# Patient Record
Sex: Male | Born: 1995 | Hispanic: Yes | Marital: Married | Smoking: Never smoker
Health system: Southern US, Community
[De-identification: ages and names within clinical notes are randomized; demographics above are authoritative.]

## PROBLEM LIST (undated history)

## (undated) DIAGNOSIS — F1010 Alcohol abuse, uncomplicated: Secondary | ICD-10-CM

## (undated) DIAGNOSIS — F1910 Other psychoactive substance abuse, uncomplicated: Secondary | ICD-10-CM

---

## 2020-10-27 ENCOUNTER — Emergency Department (HOSPITAL_BASED_OUTPATIENT_CLINIC_OR_DEPARTMENT_OTHER)
Admission: EM | Admit: 2020-10-27 | Discharge: 2020-10-27 | Disposition: A | Discharge status: Home / Self Care | Payer: 59 | Attending: Emergency Medicine | Admitting: Emergency Medicine

## 2020-10-27 ENCOUNTER — Emergency Department (HOSPITAL_BASED_OUTPATIENT_CLINIC_OR_DEPARTMENT_OTHER): Payer: 59

## 2020-10-27 ENCOUNTER — Encounter (HOSPITAL_BASED_OUTPATIENT_CLINIC_OR_DEPARTMENT_OTHER): Payer: Self-pay | Admitting: Emergency Medicine

## 2020-10-27 ENCOUNTER — Other Ambulatory Visit: Payer: Self-pay

## 2020-10-27 DIAGNOSIS — S82855A Nondisplaced trimalleolar fracture of left lower leg, initial encounter for closed fracture: Secondary | ICD-10-CM | POA: Diagnosis not present

## 2020-10-27 DIAGNOSIS — X58XXXA Exposure to other specified factors, initial encounter: Secondary | ICD-10-CM | POA: Diagnosis not present

## 2020-10-27 DIAGNOSIS — S82852A Displaced trimalleolar fracture of left lower leg, initial encounter for closed fracture: Secondary | ICD-10-CM

## 2020-10-27 DIAGNOSIS — S99912A Unspecified injury of left ankle, initial encounter: Secondary | ICD-10-CM | POA: Diagnosis present

## 2020-10-27 MED ORDER — OXYCODONE-ACETAMINOPHEN 5-325 MG PO TABS
1.0000 | ORAL_TABLET | Freq: Once | ORAL | Status: AC
Start: 2020-10-27 — End: 2020-10-27
  Administered 2020-10-27: 1 via ORAL
  Filled 2020-10-27: qty 1

## 2020-10-27 MED ORDER — OXYCODONE-ACETAMINOPHEN 5-325 MG PO TABS: 1.0000 | ORAL_TABLET | Freq: Three times a day (TID) | ORAL | 0 refills | Status: DC | PRN

## 2020-10-27 NOTE — ED Notes (Signed)
Portable Xray at bedside.

## 2020-10-27 NOTE — ED Notes (Signed)
Patient transported to CT 

## 2020-10-27 NOTE — ED Provider Notes (Signed)
[facility] EMERGENCY DEPARTMENT Provider Note   CSN: [id number] Arrival date & time: 10/27/20  9476     History Chief Complaint  Patient presents with  . Leg Pain    Jason Cruz is a 25 y.o. male.  HPI Patient presents with left ankle injury.  On 2 April patient had a trimalleolar fracture in [place].  Reportedly has seen the ER and then followed up with another clinic.  Plan was to have surgery but not done there since insurance was in [place] and patient wanted to recover here.  Now has a cast in place.  Continued pain.  Comes for referral and surgery.  No other injury.  Otherwise healthy.    History reviewed. No pertinent past medical history.  There are no problems to display for this patient.   History reviewed. No pertinent surgical history.     No family history on file.  Social History   Tobacco Use  . Smoking status: Never Smoker  . Smokeless tobacco: Never Used  Substance Use Topics  . Alcohol use: Yes    Alcohol/week: 12.0 standard drinks    Types: 12 Cans of beer per week  . Drug use: Yes    Types: Marijuana    Home Medications Prior to Admission medications   Medication Sig Start Date End Date Taking? Authorizing Provider  oxyCODONE-acetaminophen (PERCOCET/ROXICET) 5-325 MG tablet Take 1-2 tablets by mouth every 8 (eight) hours as needed for severe pain. 10/27/20  Yes Benjiman Core, MD    Allergies    Patient has no allergy information on record.  Review of Systems   Review of Systems  Constitutional: Negative for appetite change.  Respiratory: Negative for shortness of breath.   Musculoskeletal:       Left ankle pain.  Skin: Negative for wound.  Neurological: Negative for weakness.    Physical Exam Updated Vital Signs BP 118/76   Pulse 66   Temp 98 F (36.7 C) (Oral)   Resp 20   Ht 5\' 9"  (1.753 m)   Wt 80.3 kg   SpO2 100%   BMI 26.14 kg/m   Physical Exam Vitals and nursing note reviewed.  HENT:     Head:  Atraumatic.  Eyes:     General: No scleral icterus. Musculoskeletal:     Comments: Left lower leg in cast.  Sensation grossly intact in toes.  Good capillary refill in toes.  Skin:    General: Skin is warm.  Neurological:     Mental Status: He is alert and oriented to person, place, and time.     ED Results / Procedures / Treatments   Labs (all labs ordered are listed, but only abnormal results are displayed) Labs Reviewed - No data to display  EKG None  Radiology DG Ankle Complete Left  Result Date: 10/27/2020 CLINICAL DATA:  Trimalleolar fracture EXAM: LEFT ANKLE COMPLETE - 3+ VIEW COMPARISON:  None. FINDINGS: Trimalleolar ankle fracture with cast in place. Medial clear space widening to 6 mm. Mild lateral displacement of the lateral malleolus. IMPRESSION: Casted trimalleolar fracture with medial clear space widening. Electronically Signed   By: 12/27/2020 M.D.   On: 10/27/2020 08:59   CT Ankle Left Wo Contrast  Result Date: 10/27/2020 CLINICAL DATA:  Trimalleolar fracture after ankle injury a week ago. EXAM: CT OF THE LEFT ANKLE WITHOUT CONTRAST TECHNIQUE: Multidetector CT imaging of the left ankle was performed according to the standard protocol. Multiplanar CT image reconstructions were also generated. COMPARISON:  Left  ankle x-rays from same day. FINDINGS: Bones/Joint/Cartilage Acute nondisplaced predominantly longitudinal fracture of the medial malleolus. Acute minimally displaced oblique fracture of the lateral malleolus with 4 mm posterior displacement and 7 mm of overriding. Acute longitudinal fracture of the posterior malleolus with 2 mm articular surface depression (series 10, image 53). No dislocation. Unchanged mild medial clear space widening. The ankle mortise is symmetric. The talar dome is intact. Joint spaces are preserved. Small tibiotalar joint effusion. Ligaments Ligaments are suboptimally evaluated by CT. Muscles and Tendons Grossly intact. Soft tissue Diffuse  soft tissue swelling. No fluid collection or hematoma. No soft tissue mass. IMPRESSION: 1. Acute trimalleolar fracture as described above with evidence of instability. Electronically Signed   By: Obie Dredge M.D.   On: 10/27/2020 11:44    Procedures Procedures   Medications Ordered in ED Medications  oxyCODONE-acetaminophen (PERCOCET/ROXICET) 5-325 MG per tablet 1 tablet (1 tablet Oral Given 10/27/20 3825)    ED Course  I have reviewed the triage vital signs and the nursing notes.  Pertinent labs & imaging results that were available during my care of the patient were reviewed by me and considered in my medical decision making (see chart for details).    MDM Rules/Calculators/A&P                         Patient with trimalleolar ankle fracture.  Had been injured 8 days ago in [place].  Had seen Ortho follow-up but has not had surgery arranged.  Came back to [place] for treatment.  X-ray done and showed injury.  Discussed with Dr. Shon Baton from orthopedic surgery.  Recommended CT scan for operative planning and follow-up with Dr. Victorino Dike or Dr. Aundria Rud.  Should be hopefully able to get in tomorrow on Monday.  Patient given information and some pain medicine.  Outpatient follow-up tomorrow.   Final Clinical Impression(s) / ED Diagnoses Final diagnoses:  Closed trimalleolar fracture of left ankle, initial encounter    Rx / DC Orders ED Discharge Orders         Ordered    oxyCODONE-acetaminophen (PERCOCET/ROXICET) 5-325 MG tablet  Every 8 hours PRN        10/27/20 1150           Benjiman Core, MD 10/27/20 1513

## 2020-10-27 NOTE — ED Notes (Signed)
ED Provider at bedside. 

## 2020-10-27 NOTE — ED Triage Notes (Signed)
Pt arrives pov with report of L ankle fx x 1 week pta. Pt in cast, needs referral for surgery. Pt requesting pain medication. Pt reports in waiting for surgery d/t insurance issue

## 2020-10-27 NOTE — Discharge Instructions (Signed)
Call the office for follow-up with either Dr. Victorino Dike or Dr. Aundria Rud tomorrow.  We had talked with Dr. Shon Baton today.  Take the pain medicine help with the pain.

## 2020-10-28 ENCOUNTER — Other Ambulatory Visit (HOSPITAL_COMMUNITY): Payer: Self-pay | Admitting: Orthopedic Surgery

## 2020-10-28 ENCOUNTER — Encounter (HOSPITAL_BASED_OUTPATIENT_CLINIC_OR_DEPARTMENT_OTHER): Payer: Self-pay | Admitting: Orthopedic Surgery

## 2020-10-28 ENCOUNTER — Other Ambulatory Visit: Payer: Self-pay

## 2020-10-29 ENCOUNTER — Other Ambulatory Visit (HOSPITAL_COMMUNITY): Payer: 59

## 2020-10-30 ENCOUNTER — Other Ambulatory Visit (HOSPITAL_COMMUNITY)
Admission: RE | Admit: 2020-10-30 | Discharge: 2020-10-30 | Disposition: A | Discharge status: Home / Self Care | Payer: 59 | Source: Ambulatory Visit | Attending: Orthopedic Surgery | Admitting: Orthopedic Surgery

## 2020-10-30 DIAGNOSIS — Z20822 Contact with and (suspected) exposure to covid-19: Secondary | ICD-10-CM | POA: Diagnosis not present

## 2020-10-30 DIAGNOSIS — Z01812 Encounter for preprocedural laboratory examination: Secondary | ICD-10-CM | POA: Diagnosis present

## 2020-10-30 LAB — SARS CORONAVIRUS 2 (TAT 6-24 HRS): SARS Coronavirus 2: NEGATIVE

## 2020-10-30 NOTE — Progress Notes (Signed)
Text message sent reminding patient of COVID test.  

## 2020-10-31 ENCOUNTER — Encounter (HOSPITAL_BASED_OUTPATIENT_CLINIC_OR_DEPARTMENT_OTHER)
Admission: RE | Disposition: A | Discharge status: Home / Self Care | Payer: Self-pay | Source: Home / Self Care | Attending: Orthopedic Surgery

## 2020-10-31 ENCOUNTER — Ambulatory Visit (HOSPITAL_BASED_OUTPATIENT_CLINIC_OR_DEPARTMENT_OTHER)
Admission: RE | Admit: 2020-10-31 | Discharge: 2020-10-31 | Disposition: A | Discharge status: Home / Self Care | Payer: 59 | Attending: Orthopedic Surgery | Admitting: Orthopedic Surgery

## 2020-10-31 ENCOUNTER — Ambulatory Visit (HOSPITAL_BASED_OUTPATIENT_CLINIC_OR_DEPARTMENT_OTHER): Payer: 59 | Admitting: Certified Registered"

## 2020-10-31 ENCOUNTER — Other Ambulatory Visit: Payer: Self-pay

## 2020-10-31 ENCOUNTER — Encounter (HOSPITAL_BASED_OUTPATIENT_CLINIC_OR_DEPARTMENT_OTHER): Payer: Self-pay | Admitting: Orthopedic Surgery

## 2020-10-31 DIAGNOSIS — S82852A Displaced trimalleolar fracture of left lower leg, initial encounter for closed fracture: Secondary | ICD-10-CM | POA: Diagnosis not present

## 2020-10-31 HISTORY — DX: Other psychoactive substance abuse, uncomplicated: F19.10

## 2020-10-31 HISTORY — PX: ORIF ANKLE FRACTURE: SHX5408

## 2020-10-31 SURGERY — OPEN REDUCTION INTERNAL FIXATION (ORIF) ANKLE FRACTURE
Anesthesia: Regional | Site: Ankle | Laterality: Left

## 2020-10-31 MED ORDER — 0.9 % SODIUM CHLORIDE (POUR BTL) OPTIME
TOPICAL | Status: DC | PRN
  Administered 2020-10-31: 120 mL

## 2020-10-31 MED ORDER — VANCOMYCIN HCL 500 MG IV SOLR
INTRAVENOUS | Status: DC | PRN
  Administered 2020-10-31: 500 mg via TOPICAL

## 2020-10-31 MED ORDER — CEFAZOLIN SODIUM-DEXTROSE 2-4 GM/100ML-% IV SOLN
2.0000 g | INTRAVENOUS | Status: AC
  Administered 2020-10-31: 2 g via INTRAVENOUS

## 2020-10-31 MED ORDER — ONDANSETRON HCL 4 MG/2ML IJ SOLN
INTRAMUSCULAR | Status: DC | PRN
  Administered 2020-10-31: 4 mg via INTRAVENOUS

## 2020-10-31 MED ORDER — LACTATED RINGERS IV SOLN: INTRAVENOUS | Status: DC

## 2020-10-31 MED ORDER — MIDAZOLAM HCL 2 MG/2ML IJ SOLN
INTRAMUSCULAR | Status: AC
  Filled 2020-10-31: qty 2

## 2020-10-31 MED ORDER — FENTANYL CITRATE (PF) 100 MCG/2ML IJ SOLN
50.0000 ug | Freq: Once | INTRAMUSCULAR | Status: AC
  Administered 2020-10-31: 100 ug via INTRAVENOUS

## 2020-10-31 MED ORDER — FENTANYL CITRATE (PF) 100 MCG/2ML IJ SOLN
INTRAMUSCULAR | Status: AC
  Filled 2020-10-31: qty 2

## 2020-10-31 MED ORDER — ROPIVACAINE HCL 5 MG/ML IJ SOLN
INTRAMUSCULAR | Status: DC | PRN
  Administered 2020-10-31: 30 mL via PERINEURAL
  Administered 2020-10-31: 15 mL via PERINEURAL

## 2020-10-31 MED ORDER — OXYCODONE HCL 5 MG PO TABS: 5.0000 mg | ORAL_TABLET | ORAL | 0 refills | Status: AC | PRN

## 2020-10-31 MED ORDER — ASPIRIN EC 81 MG PO TBEC: 81.0000 mg | DELAYED_RELEASE_TABLET | Freq: Two times a day (BID) | ORAL | 0 refills | Status: DC

## 2020-10-31 MED ORDER — DEXAMETHASONE SODIUM PHOSPHATE 10 MG/ML IJ SOLN
INTRAMUSCULAR | Status: DC | PRN
  Administered 2020-10-31 (×2): 5 mg

## 2020-10-31 MED ORDER — CEFAZOLIN SODIUM-DEXTROSE 2-4 GM/100ML-% IV SOLN
INTRAVENOUS | Status: AC
  Filled 2020-10-31: qty 100

## 2020-10-31 MED ORDER — PROPOFOL 10 MG/ML IV BOLUS
INTRAVENOUS | Status: DC | PRN
  Administered 2020-10-31: 200 mg via INTRAVENOUS

## 2020-10-31 MED ORDER — SODIUM CHLORIDE 0.9 % IV SOLN: INTRAVENOUS | Status: DC

## 2020-10-31 MED ORDER — MIDAZOLAM HCL 2 MG/2ML IJ SOLN
1.0000 mg | Freq: Once | INTRAMUSCULAR | Status: AC
  Administered 2020-10-31: 2 mg via INTRAVENOUS

## 2020-10-31 MED ORDER — FENTANYL CITRATE (PF) 100 MCG/2ML IJ SOLN
25.0000 ug | INTRAMUSCULAR | Status: DC | PRN
  Administered 2020-10-31 (×2): 50 ug via INTRAVENOUS

## 2020-10-31 MED ORDER — SENNA 8.6 MG PO TABS: 2.0000 | ORAL_TABLET | Freq: Two times a day (BID) | ORAL | 0 refills | Status: DC

## 2020-10-31 MED ORDER — DOCUSATE SODIUM 100 MG PO CAPS: 100.0000 mg | ORAL_CAPSULE | Freq: Two times a day (BID) | ORAL | 0 refills | Status: AC

## 2020-10-31 MED ORDER — PROPOFOL 500 MG/50ML IV EMUL
INTRAVENOUS | Status: DC | PRN
  Administered 2020-10-31: 25 ug/kg/min via INTRAVENOUS

## 2020-10-31 MED ORDER — VANCOMYCIN HCL 500 MG IV SOLR
INTRAVENOUS | Status: AC
  Filled 2020-10-31: qty 500

## 2020-10-31 MED ORDER — DEXAMETHASONE SODIUM PHOSPHATE 10 MG/ML IJ SOLN
INTRAMUSCULAR | Status: DC | PRN
  Administered 2020-10-31: 4 mg via INTRAVENOUS

## 2020-10-31 SURGICAL SUPPLY — 83 items
APL PRP STRL LF DISP 70% ISPRP (MISCELLANEOUS) ×1
BANDAGE ESMARK 6X9 LF (GAUZE/BANDAGES/DRESSINGS) IMPLANT
BIT DRILL 2.5X2.75 QC CALB (BIT) ×2 IMPLANT
BIT DRILL 2.9 CANN QC NONSTRL (BIT) ×2 IMPLANT
BIT DRILL 3.5X5.5 QC CALB (BIT) ×2 IMPLANT
BLADE SURG 15 STRL LF DISP TIS (BLADE) ×2 IMPLANT
BLADE SURG 15 STRL SS (BLADE) ×4
BNDG CMPR 9X4 STRL LF SNTH (GAUZE/BANDAGES/DRESSINGS)
BNDG CMPR 9X6 STRL LF SNTH (GAUZE/BANDAGES/DRESSINGS)
BNDG COHESIVE 4X5 TAN STRL (GAUZE/BANDAGES/DRESSINGS) ×2 IMPLANT
BNDG COHESIVE 6X5 TAN STRL LF (GAUZE/BANDAGES/DRESSINGS) ×2 IMPLANT
BNDG ESMARK 4X9 LF (GAUZE/BANDAGES/DRESSINGS) IMPLANT
BNDG ESMARK 6X9 LF (GAUZE/BANDAGES/DRESSINGS)
CANISTER SUCT 1200ML W/VALVE (MISCELLANEOUS) ×2 IMPLANT
CHLORAPREP W/TINT 26 (MISCELLANEOUS) ×2 IMPLANT
COVER BACK TABLE 60X90IN (DRAPES) ×2 IMPLANT
COVER WAND RF STERILE (DRAPES) IMPLANT
CUFF TOURN SGL QUICK 34 (TOURNIQUET CUFF) ×2
CUFF TRNQT CYL 34X4.125X (TOURNIQUET CUFF) ×1 IMPLANT
DECANTER SPIKE VIAL GLASS SM (MISCELLANEOUS) IMPLANT
DRAPE EXTREMITY T 121X128X90 (DISPOSABLE) ×2 IMPLANT
DRAPE OEC MINIVIEW 54X84 (DRAPES) ×2 IMPLANT
DRAPE U-SHAPE 47X51 STRL (DRAPES) ×2 IMPLANT
DRSG MEPITEL 4X7.2 (GAUZE/BANDAGES/DRESSINGS) ×2 IMPLANT
DRSG PAD ABDOMINAL 8X10 ST (GAUZE/BANDAGES/DRESSINGS) ×4 IMPLANT
ELECT REM PT RETURN 9FT ADLT (ELECTROSURGICAL) ×2
ELECTRODE REM PT RTRN 9FT ADLT (ELECTROSURGICAL) ×1 IMPLANT
GAUZE SPONGE 4X4 12PLY STRL (GAUZE/BANDAGES/DRESSINGS) ×2 IMPLANT
GLOVE SRG 8 PF TXTR STRL LF DI (GLOVE) ×2 IMPLANT
GLOVE SURG ENC MOIS LTX SZ8 (GLOVE) ×2 IMPLANT
GLOVE SURG LTX SZ8 (GLOVE) ×2 IMPLANT
GLOVE SURG POLYISO LF SZ7 (GLOVE) ×4 IMPLANT
GLOVE SURG UNDER POLY LF SZ7 (GLOVE) ×2 IMPLANT
GLOVE SURG UNDER POLY LF SZ8 (GLOVE) ×4
GOWN STRL REUS W/ TWL LRG LVL3 (GOWN DISPOSABLE) ×1 IMPLANT
GOWN STRL REUS W/ TWL XL LVL3 (GOWN DISPOSABLE) ×2 IMPLANT
GOWN STRL REUS W/TWL LRG LVL3 (GOWN DISPOSABLE) ×2
GOWN STRL REUS W/TWL XL LVL3 (GOWN DISPOSABLE) ×4
K-WIRE ACE 1.6X6 (WIRE) ×4
KWIRE ACE 1.6X6 (WIRE) ×2 IMPLANT
NEEDLE HYPO 22GX1.5 SAFETY (NEEDLE) IMPLANT
NS IRRIG 1000ML POUR BTL (IV SOLUTION) ×2 IMPLANT
PACK BASIN DAY SURGERY FS (CUSTOM PROCEDURE TRAY) ×2 IMPLANT
PAD CAST 4YDX4 CTTN HI CHSV (CAST SUPPLIES) ×1 IMPLANT
PADDING CAST ABS 4INX4YD NS (CAST SUPPLIES)
PADDING CAST ABS COTTON 4X4 ST (CAST SUPPLIES) IMPLANT
PADDING CAST COTTON 4X4 STRL (CAST SUPPLIES) ×2
PADDING CAST COTTON 6X4 STRL (CAST SUPPLIES) ×2 IMPLANT
PENCIL SMOKE EVACUATOR (MISCELLANEOUS) ×2 IMPLANT
PLATE ACE 100DEG 6HOLE (Plate) ×2 IMPLANT
PLATE ACE 3.5MM 2HOLE (Plate) ×2 IMPLANT
SANITIZER HAND PURELL 535ML FO (MISCELLANEOUS) ×2 IMPLANT
SCREW ACE CAN 4.0 42M (Screw) ×2 IMPLANT
SCREW ACE CAN 4.0 46M (Screw) ×2 IMPLANT
SCREW CANN ACE 4.0X32 (Screw) ×2 IMPLANT
SCREW CORTICAL 3.5MM  16MM (Screw) ×2 IMPLANT
SCREW CORTICAL 3.5MM  20MM (Screw) ×1 IMPLANT
SCREW CORTICAL 3.5MM 14MM (Screw) ×4 IMPLANT
SCREW CORTICAL 3.5MM 16MM (Screw) ×2 IMPLANT
SCREW CORTICAL 3.5MM 20MM (Screw) ×1 IMPLANT
SCREW CORTICAL 3.5MM 24MM (Screw) ×2 IMPLANT
SCREW CORTICAL 3.5MM 38MM (Screw) ×2 IMPLANT
SHEET MEDIUM DRAPE 40X70 STRL (DRAPES) ×2 IMPLANT
SLEEVE SCD COMPRESS KNEE MED (STOCKING) ×2 IMPLANT
SPLINT FAST PLASTER 5X30 (CAST SUPPLIES) ×20
SPLINT PLASTER CAST FAST 5X30 (CAST SUPPLIES) ×20 IMPLANT
SPONGE LAP 18X18 RF (DISPOSABLE) ×2 IMPLANT
STOCKINETTE 6  STRL (DRAPES) ×1
STOCKINETTE 6 STRL (DRAPES) ×1 IMPLANT
SUCTION FRAZIER HANDLE 10FR (MISCELLANEOUS) ×1
SUCTION TUBE FRAZIER 10FR DISP (MISCELLANEOUS) ×1 IMPLANT
SUT ETHILON 3 0 PS 1 (SUTURE) ×4 IMPLANT
SUT FIBERWIRE #2 38 T-5 BLUE (SUTURE)
SUT MNCRL AB 3-0 PS2 18 (SUTURE) IMPLANT
SUT VIC AB 0 SH 27 (SUTURE) IMPLANT
SUT VIC AB 2-0 SH 27 (SUTURE) ×4
SUT VIC AB 2-0 SH 27XBRD (SUTURE) ×2 IMPLANT
SUTURE FIBERWR #2 38 T-5 BLUE (SUTURE) IMPLANT
SYR BULB EAR ULCER 3OZ GRN STR (SYRINGE) ×2 IMPLANT
SYR CONTROL 10ML LL (SYRINGE) IMPLANT
TOWEL GREEN STERILE FF (TOWEL DISPOSABLE) ×4 IMPLANT
TUBE CONNECTING 20X1/4 (TUBING) ×2 IMPLANT
UNDERPAD 30X36 HEAVY ABSORB (UNDERPADS AND DIAPERS) ×2 IMPLANT

## 2020-10-31 NOTE — Discharge Instructions (Addendum)
John Hewitt, MD [facility]  Please read the following information regarding your care after surgery.  Medications  You only need a prescription for the narcotic pain medicine (ex. oxycodone, Percocet, Norco).  All of the other medicines listed below are available over the counter. X Aleve 2 pills twice a day for the first 3 days after surgery. X acetominophen (Tylenol) 650 mg every 4-6 hours as you need for minor to moderate pain X oxycodone as prescribed for severe pain  Narcotic pain medicine (ex. oxycodone, Percocet, Vicodin) will cause constipation.  To prevent this problem, take the following medicines while you are taking any pain medicine. X docusate sodium (Colace) 100 mg twice a day X senna (Senokot) 2 tablets twice a day  X To help prevent blood clots, take a baby aspirin (81 mg) twice a day for six weeks after surgery.  You should also get up every hour while you are awake to move around.    Weight Bearing X Do not bear any weight on the operated leg or foot.  Cast / Splint / Dressing X Keep your splint, cast or dressing clean and dry.  Don't put anything (coat hanger, pencil, etc) down inside of it.  If it gets damp, use a hair dryer on the cool setting to dry it.  If it gets soaked, call the office to schedule an appointment for a cast change.   After your dressing, cast or splint is removed; you may shower, but do not soak or scrub the wound.  Allow the water to run over it, and then gently pat it dry.  Swelling It is normal for you to have swelling where you had surgery.  To reduce swelling and pain, keep your toes above your nose for at least 3 days after surgery.  It may be necessary to keep your foot or leg elevated for several weeks.  If it hurts, it should be elevated.  Follow Up Call my office at [phone] when you are discharged from the hospital or surgery center to schedule an appointment to be seen two weeks after surgery.  Call my office at [phone] if  you develop a fever >101.5 F, nausea, vomiting, bleeding from the surgical site or severe pain.     Post Anesthesia Home Care Instructions  Activity: Get plenty of rest for the remainder of the day. A responsible individual must stay with you for 24 hours following the procedure.  For the next 24 hours, DO NOT: -Drive a car -Operate machinery -Drink alcoholic beverages -Take any medication unless instructed by your physician -Make any legal decisions or sign important papers.  Meals: Start with liquid foods such as gelatin or soup. Progress to regular foods as tolerated. Avoid greasy, spicy, heavy foods. If nausea and/or vomiting occur, drink only clear liquids until the nausea and/or vomiting subsides. Call your physician if vomiting continues.  Special Instructions/Symptoms: Your throat may feel dry or sore from the anesthesia or the breathing tube placed in your throat during surgery. If this causes discomfort, gargle with warm salt water. The discomfort should disappear within 24 hours.  If you had a scopolamine patch placed behind your ear for the management of post- operative nausea and/or vomiting:  1. The medication in the patch is effective for 72 hours, after which it should be removed.  Wrap patch in a tissue and discard in the trash. Wash hands thoroughly with soap and water. 2. You may remove the patch earlier than 72 hours if you experience   unpleasant side effects which may include dry mouth, dizziness or visual disturbances. 3. Avoid touching the patch. Wash your hands with soap and water after contact with the patch.         Regional Anesthesia Blocks  1. Numbness or the inability to move the "blocked" extremity may last from 3-48 hours after placement. The length of time depends on the medication injected and your individual response to the medication. If the numbness is not going away after 48 hours, call your surgeon.  2. The extremity that is blocked will need to  be protected until the numbness is gone and the  Strength has returned. Because you cannot feel it, you will need to take extra care to avoid injury. Because it may be weak, you may have difficulty moving it or using it. You may not know what position it is in without looking at it while the block is in effect.  3. For blocks in the legs and feet, returning to weight bearing and walking needs to be done carefully. You will need to wait until the numbness is entirely gone and the strength has returned. You should be able to move your leg and foot normally before you try and bear weight or walk. You will need someone to be with you when you first try to ensure you do not fall and possibly risk injury.  4. Bruising and tenderness at the needle site are common side effects and will resolve in a few days.  5. Persistent numbness or new problems with movement should be communicated to the surgeon or the [facility] ([phone])/ [facility] ([phone]).  

## 2020-10-31 NOTE — Anesthesia Procedure Notes (Signed)
Procedure Name: LMA Insertion Date/Time: 10/31/2020 9:30 AM Performed by: Sheryn Bison, CRNA Pre-anesthesia Checklist: Patient identified, Emergency Drugs available, Suction available and Patient being monitored Patient Re-evaluated:Patient Re-evaluated prior to induction Oxygen Delivery Method: Circle System Utilized Preoxygenation: Pre-oxygenation with 100% oxygen Induction Type: IV induction Ventilation: Mask ventilation without difficulty LMA: LMA inserted LMA Size: 4.0 Number of attempts: 1 Airway Equipment and Method: bite block Placement Confirmation: positive ETCO2 Tube secured with: Tape Dental Injury: Teeth and Oropharynx as per pre-operative assessment

## 2020-10-31 NOTE — Op Note (Signed)
10/31/2020  10:43 AM  PATIENT:  Jason Cruz  25 y.o. male  PRE-OPERATIVE DIAGNOSIS:  left ankle trimalleolar fracture  POST-OPERATIVE DIAGNOSIS:  left ankle trimalleolar fracture  Procedure(s): 1.  Open treatment left ankle trimalleolar fracture with internal fixation including fixation of the posterior lip 2.  Stress examination of the left ankle 3.  AP, mortise and lateral radiographs of the left ankle.  SURGEON:  Toni Arthurs, MD  ASSISTANT: Alfredo Martinez, PA-C  ANESTHESIA:   General, regional  EBL:  minimal   TOURNIQUET:   Total Tourniquet Time Documented: Thigh (Left) - 46 minutes Total: Thigh (Left) - 46 minutes  COMPLICATIONS:  None apparent  DISPOSITION:  Extubated, awake and stable to recovery.  INDICATION FOR PROCEDURE: The patient is a 25 year old male who sustained a left ankle trimalleolar fracture about a week and a half ago.  He presents now for operative treatment of this displaced and unstable left ankle injury.  The risks and benefits of the alternative treatment options have been discussed in detail.  The patient wishes to proceed with surgery and specifically understands risks of bleeding, infection, nerve damage, blood clots, need for additional surgery, amputation and death.  PROCEDURE IN DETAIL:  After pre operative consent was obtained, and the correct operative site was identified, the patient was brought to the operating room and placed supine on the OR table.  Anesthesia was administered.  Pre-operative antibiotics were administered.  A surgical timeout was taken.  The left lower extremity was prepped and draped in standard sterile fashion with a tourniquet around the thigh.  The extremity was elevated and the tourniquet was inflated to 250 mmHg.  A longitudinal incision was made over the lateral malleolus.  Dissection was carried down through the subcutaneous tissues.  The fracture site was cleaned of all hematoma and irrigated.  It was opened and used to access  the posterior malleolus fracture.  The posterior malleolus fracture was reduced and held with a pointed tenaculum.  A stab incision was made anteriorly.  A K wire was inserted from anterior to posterior across the fracture site.  Radiographs confirmed appropriate reduction of the posterior malleolus fracture and appropriate position of the K wire.  It was overdrilled and a 4 mm partially-threaded cannulated screw from the [employer] small frag set was inserted.  It was secured and tightened appropriately.  The lateral malleolus fracture was then reduced and held with a lobster claw clamp.  A 3.5 mm fully threaded lag screw was inserted from anterior to posterior.  It was noted to compress the fracture site appropriately.  A 6 hole one third tubular plate was then contoured to fit the lateral malleolus.  It was secured proximally with 3 bicortical screws and distally with 3 unicortical screws.  Attention was turned to the medial ankle.  A longitudinal incision was made over the posterior medial aspect of the distal tibia.  Dissection was carried down through the subcutaneous tissues.  Care was taken to protect the saphenous vein and nerve.  Periosteum was incised and the fracture site was identified.  It was cleaned of all hematoma and irrigated.  The fracture was reduced and held with a pointed tenaculum.  A 2 hole one third tubular plate was positioned over the apex of the fracture as a buttress.  A fully threaded 3.5 mm cortical screw was inserted proximal to the fracture line.  The distal hole in the plate was used to insert a cannulated 4 mm partially-threaded screw.  This was  noted to have excellent purchase and compressed the fracture site appropriately.  AP, mortise and lateral radiographs confirmed appropriate reduction and fixation of all 3 fractures.  Stress examination was then performed.  Dorsiflexion and external rotation stress was applied to the supinated forefoot.  A mortise view was  obtained.  There was no widening noted at the medial clear space and no decrease in the distal tib-fib overlap.  The wounds were irrigated copiously and sprinkled with vancomycin powder.  Periosteum was repaired with Vicryl.  Subcutaneous tissues were approximated with Vicryl.  Skin incisions were closed with nylon.  Sterile dressings were applied followed by a well-padded short leg splint.  Tourniquet was released after application of the dressings.  The patient was awakened from anesthesia and transported to the recovery room in stable condition.   FOLLOW UP PLAN: Nonweightbearing on the left lower extremity for the next 2 weeks.  Follow-up in the office for suture removal and conversion to a cam boot to initiate early range of motion.  Aspirin for DVT prophylaxis.   RADIOGRAPHS: AP, lateral and mortise radiographs of the left ankle are obtained intraoperatively.  These show interval reduction fixation of the medial, lateral and posterior malleolus fractures.  Hardware is appropriately positioned and of the appropriate lengths.  No other acute injuries are noted.    Alfredo Martinez PA-C was present and scrubbed for the duration of the operative case. His assistance was essential in positioning the patient, prepping and draping, gaining and maintaining exposure, performing the operation, closing and dressing the wounds and applying the splint.

## 2020-10-31 NOTE — Anesthesia Procedure Notes (Signed)
Anesthesia Regional Block: Popliteal block   Pre-Anesthetic Checklist: ,, timeout performed, Correct Patient, Correct Site, Correct Laterality, Correct Procedure, Correct Position, site marked, Risks and benefits discussed,  Surgical consent,  Pre-op evaluation,  At surgeon's request and post-op pain management  Laterality: Left  Prep: Maximum Sterile Barrier Precautions used, chloraprep       Needles:  Injection technique: Single-shot  Needle Type: Echogenic Stimulator Needle     Needle Length: 9cm  Needle Gauge: 22     Additional Needles:   Procedures:,,,, ultrasound used (permanent image in chart),,,,  Narrative:  Start time: 10/31/2020 8:34 AM End time: 10/31/2020 8:44 AM Injection made incrementally with aspirations every 5 mL.  Performed by: Personally  Anesthesiologist: Elmer Picker, MD  Additional Notes: Monitors applied. No increased pain on injection. No increased resistance to injection. Injection made in 5cc increments. Good needle visualization. Patient tolerated procedure well.

## 2020-10-31 NOTE — Anesthesia Postprocedure Evaluation (Signed)
Anesthesia Post Note  Patient: Jason Cruz  Procedure(s) Performed: Open Reduction Internal Fixation (ORIF) Left trimalleolar fracture (Left Ankle)     Patient location during evaluation: PACU Anesthesia Type: Regional and General Level of consciousness: awake and alert Pain management: pain level controlled Vital Signs Assessment: post-procedure vital signs reviewed and stable Respiratory status: spontaneous breathing, nonlabored ventilation, respiratory function stable and patient connected to nasal cannula oxygen Cardiovascular status: blood pressure returned to baseline and stable Postop Assessment: no apparent nausea or vomiting Anesthetic complications: no   No complications documented.  Last Vitals:  Vitals:   10/31/20 1121 10/31/20 1152  BP:  127/84  Pulse:  (!) 58  Resp: 13 14  Temp:  36.4 C  SpO2:  100%    Last Pain:  Vitals:   10/31/20 1130  TempSrc:   PainSc: 0-No pain                 Demarkis Gheen L Willet Schleifer

## 2020-10-31 NOTE — Transfer of Care (Signed)
Immediate Anesthesia Transfer of Care Note  Patient: Jason Cruz  Procedure(s) Performed: Open Reduction Internal Fixation (ORIF) Left trimalleolar fracture (Left Ankle)  Patient Location: PACU  Anesthesia Type:GA combined with regional for post-op pain  Level of Consciousness: drowsy and patient cooperative  Airway & Oxygen Therapy: Patient Spontanous Breathing and Patient connected to face mask oxygen  Post-op Assessment: Report given to RN and Post -op Vital signs reviewed and stable  Post vital signs: Reviewed and stable  Last Vitals:  Vitals Value Taken Time  BP    Temp    Pulse 63 10/31/20 1037  Resp    SpO2 100 % 10/31/20 1037  Vitals shown include unvalidated device data.  Last Pain:  Vitals:   10/31/20 0731  TempSrc: Oral  PainSc: 8       Patients Stated Pain Goal: 4 (10/31/20 0731)  Complications: No complications documented.

## 2020-10-31 NOTE — Anesthesia Procedure Notes (Signed)
Anesthesia Regional Block: Adductor canal block   Pre-Anesthetic Checklist: ,, timeout performed, Correct Patient, Correct Site, Correct Laterality, Correct Procedure, Correct Position, site marked, Risks and benefits discussed,  Surgical consent,  Pre-op evaluation,  At surgeon's request and post-op pain management  Laterality: Left  Prep: Maximum Sterile Barrier Precautions used, chloraprep       Needles:  Injection technique: Single-shot  Needle Type: Echogenic Stimulator Needle     Needle Length: 9cm  Needle Gauge: 22     Additional Needles:   Procedures:,,,, ultrasound used (permanent image in chart),,,,  Narrative:  Start time: 10/31/2020 8:44 AM End time: 10/31/2020 8:49 AM Injection made incrementally with aspirations every 5 mL.  Performed by: Personally  Anesthesiologist: Elmer Picker, MD  Additional Notes: Monitors applied. No increased pain on injection. No increased resistance to injection. Injection made in 5cc increments. Good needle visualization. Patient tolerated procedure well.
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
## 2020-10-31 NOTE — Progress Notes (Signed)
Assisted Dr. Woodrum with left, ultrasound guided, popliteal, adductor canal block. Side rails up, monitors on throughout procedure. See vital signs in flow sheet. Tolerated Procedure well. °

## 2020-10-31 NOTE — Anesthesia Preprocedure Evaluation (Addendum)
Anesthesia Evaluation  Patient identified by MRN, date of birth, ID band Patient awake    Reviewed: Allergy & Precautions, NPO status , Patient's Chart, lab work & pertinent test results  Airway Mallampati: II  TM Distance: >3 FB Neck ROM: Full    Dental no notable dental hx. (+) Teeth Intact, Dental Advisory Given   Pulmonary neg pulmonary ROS, Patient abstained from smoking.,    Pulmonary exam normal breath sounds clear to auscultation       Cardiovascular negative cardio ROS Normal cardiovascular exam Rhythm:Regular Rate:Normal     Neuro/Psych negative neurological ROS  negative psych ROS   GI/Hepatic negative GI ROS, (+)     substance abuse  alcohol use, cocaine use and marijuana use,   Endo/Other  negative endocrine ROS  Renal/GU negative Renal ROS  negative genitourinary   Musculoskeletal negative musculoskeletal ROS (+)   Abdominal   Peds  Hematology negative hematology ROS (+)   Anesthesia Other Findings   Reproductive/Obstetrics                            Anesthesia Physical Anesthesia Plan  ASA: II  Anesthesia Plan: General and Regional   Post-op Pain Management:  Regional for Post-op pain   Induction: Intravenous  PONV Risk Score and Plan: 2 and Ondansetron, Dexamethasone and Midazolam  Airway Management Planned: LMA  Additional Equipment:   Intra-op Plan:   Post-operative Plan: Extubation in OR  Informed Consent: I have reviewed the patients History and Physical, chart, labs and discussed the procedure including the risks, benefits and alternatives for the proposed anesthesia with the patient or authorized representative who has indicated his/her understanding and acceptance.     Dental advisory given  Plan Discussed with: CRNA  Anesthesia Plan Comments:         Anesthesia Quick Evaluation

## 2020-11-04 ENCOUNTER — Encounter (HOSPITAL_BASED_OUTPATIENT_CLINIC_OR_DEPARTMENT_OTHER): Payer: Self-pay | Admitting: Orthopedic Surgery

## 2020-11-08 ENCOUNTER — Emergency Department (HOSPITAL_BASED_OUTPATIENT_CLINIC_OR_DEPARTMENT_OTHER): Payer: 59

## 2020-11-08 ENCOUNTER — Inpatient Hospital Stay (HOSPITAL_BASED_OUTPATIENT_CLINIC_OR_DEPARTMENT_OTHER)
Admission: EM | Admit: 2020-11-08 | Discharge: 2020-11-11 | DRG: 369 | Disposition: A | Discharge status: Home / Self Care | Payer: 59 | Attending: Internal Medicine | Admitting: Internal Medicine

## 2020-11-08 ENCOUNTER — Other Ambulatory Visit: Payer: Self-pay

## 2020-11-08 ENCOUNTER — Encounter (HOSPITAL_BASED_OUTPATIENT_CLINIC_OR_DEPARTMENT_OTHER): Payer: Self-pay | Admitting: *Deleted

## 2020-11-08 DIAGNOSIS — K226 Gastro-esophageal laceration-hemorrhage syndrome: Principal | ICD-10-CM

## 2020-11-08 DIAGNOSIS — S82402D Unspecified fracture of shaft of left fibula, subsequent encounter for closed fracture with routine healing: Secondary | ICD-10-CM

## 2020-11-08 DIAGNOSIS — Z7982 Long term (current) use of aspirin: Secondary | ICD-10-CM

## 2020-11-08 DIAGNOSIS — K250 Acute gastric ulcer with hemorrhage: Secondary | ICD-10-CM

## 2020-11-08 DIAGNOSIS — Z8719 Personal history of other diseases of the digestive system: Secondary | ICD-10-CM

## 2020-11-08 DIAGNOSIS — Z791 Long term (current) use of non-steroidal anti-inflammatories (NSAID): Secondary | ICD-10-CM

## 2020-11-08 DIAGNOSIS — F1490 Cocaine use, unspecified, uncomplicated: Secondary | ICD-10-CM | POA: Diagnosis present

## 2020-11-08 DIAGNOSIS — K922 Gastrointestinal hemorrhage, unspecified: Secondary | ICD-10-CM | POA: Diagnosis present

## 2020-11-08 DIAGNOSIS — F1120 Opioid dependence, uncomplicated: Secondary | ICD-10-CM

## 2020-11-08 DIAGNOSIS — Z7289 Other problems related to lifestyle: Secondary | ICD-10-CM

## 2020-11-08 DIAGNOSIS — K449 Diaphragmatic hernia without obstruction or gangrene: Secondary | ICD-10-CM | POA: Diagnosis present

## 2020-11-08 DIAGNOSIS — F1290 Cannabis use, unspecified, uncomplicated: Secondary | ICD-10-CM | POA: Diagnosis present

## 2020-11-08 DIAGNOSIS — R109 Unspecified abdominal pain: Secondary | ICD-10-CM | POA: Diagnosis not present

## 2020-11-08 DIAGNOSIS — F1010 Alcohol abuse, uncomplicated: Secondary | ICD-10-CM | POA: Diagnosis present

## 2020-11-08 DIAGNOSIS — S82202D Unspecified fracture of shaft of left tibia, subsequent encounter for closed fracture with routine healing: Secondary | ICD-10-CM

## 2020-11-08 DIAGNOSIS — Z20822 Contact with and (suspected) exposure to covid-19: Secondary | ICD-10-CM | POA: Diagnosis present

## 2020-11-08 DIAGNOSIS — F1910 Other psychoactive substance abuse, uncomplicated: Secondary | ICD-10-CM | POA: Diagnosis present

## 2020-11-08 DIAGNOSIS — K2980 Duodenitis without bleeding: Secondary | ICD-10-CM | POA: Diagnosis present

## 2020-11-08 DIAGNOSIS — K269 Duodenal ulcer, unspecified as acute or chronic, without hemorrhage or perforation: Secondary | ICD-10-CM | POA: Diagnosis present

## 2020-11-08 DIAGNOSIS — K2970 Gastritis, unspecified, without bleeding: Secondary | ICD-10-CM

## 2020-11-08 DIAGNOSIS — Z79899 Other long term (current) drug therapy: Secondary | ICD-10-CM

## 2020-11-08 DIAGNOSIS — K2990 Gastroduodenitis, unspecified, without bleeding: Secondary | ICD-10-CM | POA: Diagnosis present

## 2020-11-08 DIAGNOSIS — Z789 Other specified health status: Secondary | ICD-10-CM

## 2020-11-08 DIAGNOSIS — D72829 Elevated white blood cell count, unspecified: Secondary | ICD-10-CM | POA: Diagnosis present

## 2020-11-08 DIAGNOSIS — R1013 Epigastric pain: Secondary | ICD-10-CM

## 2020-11-08 DIAGNOSIS — S82852A Displaced trimalleolar fracture of left lower leg, initial encounter for closed fracture: Secondary | ICD-10-CM | POA: Diagnosis present

## 2020-11-08 DIAGNOSIS — F1920 Other psychoactive substance dependence, uncomplicated: Secondary | ICD-10-CM

## 2020-11-08 DIAGNOSIS — K209 Esophagitis, unspecified without bleeding: Secondary | ICD-10-CM | POA: Diagnosis present

## 2020-11-08 LAB — CBC WITH DIFFERENTIAL/PLATELET
Abs Immature Granulocytes: 0.18 10*3/uL — ABNORMAL HIGH (ref 0.00–0.07)
Basophils Absolute: 0.1 10*3/uL (ref 0.0–0.1)
Basophils Relative: 0 %
Eosinophils Absolute: 0 10*3/uL (ref 0.0–0.5)
Eosinophils Relative: 0 %
HCT: 48.3 % (ref 39.0–52.0)
Hemoglobin: 16.2 g/dL (ref 13.0–17.0)
Immature Granulocytes: 1 %
Lymphocytes Relative: 12 %
Lymphs Abs: 2.2 10*3/uL (ref 0.7–4.0)
MCH: 29.3 pg (ref 26.0–34.0)
MCHC: 33.5 g/dL (ref 30.0–36.0)
MCV: 87.3 fL (ref 80.0–100.0)
Monocytes Absolute: 1.3 10*3/uL — ABNORMAL HIGH (ref 0.1–1.0)
Monocytes Relative: 7 %
Neutro Abs: 15.7 10*3/uL — ABNORMAL HIGH (ref 1.7–7.7)
Neutrophils Relative %: 80 %
Platelets: 505 10*3/uL — ABNORMAL HIGH (ref 150–400)
RBC: 5.53 MIL/uL (ref 4.22–5.81)
RDW: 13.4 % (ref 11.5–15.5)
WBC: 19.4 10*3/uL — ABNORMAL HIGH (ref 4.0–10.5)
nRBC: 0 % (ref 0.0–0.2)

## 2020-11-08 LAB — URINALYSIS, MICROSCOPIC (REFLEX)

## 2020-11-08 LAB — COMPREHENSIVE METABOLIC PANEL
ALT: 19 U/L (ref 0–44)
AST: 29 U/L (ref 15–41)
Albumin: 5.1 g/dL — ABNORMAL HIGH (ref 3.5–5.0)
Alkaline Phosphatase: 61 U/L (ref 38–126)
Anion gap: 15 (ref 5–15)
BUN: 19 mg/dL (ref 6–20)
CO2: 21 mmol/L — ABNORMAL LOW (ref 22–32)
Calcium: 10.2 mg/dL (ref 8.9–10.3)
Chloride: 101 mmol/L (ref 98–111)
Creatinine, Ser: 0.97 mg/dL (ref 0.61–1.24)
GFR, Estimated: >60 mL/min (ref >60)
Glucose, Bld: 133 mg/dL — ABNORMAL HIGH (ref 70–99)
Potassium: 3.8 mmol/L (ref 3.5–5.1)
Sodium: 137 mmol/L (ref 135–145)
Total Bilirubin: 0.7 mg/dL (ref 0.3–1.2)
Total Protein: 8.5 g/dL — ABNORMAL HIGH (ref 6.5–8.1)

## 2020-11-08 LAB — SALICYLATE LEVEL: Salicylate Lvl: <7 mg/dL — ABNORMAL LOW (ref 7.0–30.0)

## 2020-11-08 LAB — RAPID URINE DRUG SCREEN, HOSP PERFORMED
Amphetamines: NOT DETECTED
Barbiturates: NOT DETECTED
Benzodiazepines: NOT DETECTED
Cocaine: POSITIVE — AB
Opiates: POSITIVE — AB
Tetrahydrocannabinol: POSITIVE — AB

## 2020-11-08 LAB — PROTIME-INR
INR: 1 (ref 0.8–1.2)
Prothrombin Time: 12.9 seconds (ref 11.4–15.2)

## 2020-11-08 LAB — OCCULT BLOOD GASTRIC / DUODENUM (SPECIMEN CUP)
Occult Blood, Gastric: POSITIVE — AB
pH, Gastric: 3

## 2020-11-08 LAB — LIPASE, BLOOD: Lipase: 30 U/L (ref 11–51)

## 2020-11-08 LAB — URINALYSIS, ROUTINE W REFLEX MICROSCOPIC
Bilirubin Urine: NEGATIVE
Glucose, UA: NEGATIVE mg/dL
Ketones, ur: >80 mg/dL — AB
Leukocytes,Ua: NEGATIVE
Nitrite: NEGATIVE
Protein, ur: NEGATIVE mg/dL
Specific Gravity, Urine: 1.01 (ref 1.005–1.030)
pH: 8 (ref 5.0–8.0)

## 2020-11-08 LAB — ACETAMINOPHEN LEVEL: Acetaminophen (Tylenol), Serum: <10 ug/mL — ABNORMAL LOW (ref 10–30)

## 2020-11-08 LAB — RESP PANEL BY RT-PCR (FLU A&B, COVID) ARPGX2
Influenza A by PCR: NEGATIVE
Influenza B by PCR: NEGATIVE
SARS Coronavirus 2 by RT PCR: NEGATIVE

## 2020-11-08 LAB — OCCULT BLOOD X 1 CARD TO LAB, STOOL: Fecal Occult Bld: NEGATIVE

## 2020-11-08 LAB — HEMOGLOBIN AND HEMATOCRIT, BLOOD
HCT: 41.9 % (ref 39.0–52.0)
Hemoglobin: 14 g/dL (ref 13.0–17.0)

## 2020-11-08 MED ORDER — IOHEXOL 300 MG/ML  SOLN
100.0000 mL | Freq: Once | INTRAMUSCULAR | Status: AC | PRN
  Administered 2020-11-08: 100 mL via INTRAVENOUS

## 2020-11-08 MED ORDER — PANTOPRAZOLE SODIUM 40 MG IV SOLR
INTRAVENOUS | Status: AC
  Administered 2020-11-08: 80 mg
  Filled 2020-11-08: qty 80

## 2020-11-08 MED ORDER — SODIUM CHLORIDE 0.9 % IV SOLN
80.0000 mg | Freq: Once | INTRAVENOUS | Status: AC
  Administered 2020-11-08: 80 mg via INTRAVENOUS
  Filled 2020-11-08: qty 80

## 2020-11-08 MED ORDER — SODIUM CHLORIDE 0.9 % IV SOLN
8.0000 mg/h | INTRAVENOUS | Status: DC
  Administered 2020-11-08 – 2020-11-09 (×2): 8 mg/h via INTRAVENOUS
  Filled 2020-11-08 (×2): qty 80

## 2020-11-08 MED ORDER — DROPERIDOL 2.5 MG/ML IJ SOLN
2.5000 mg | Freq: Once | INTRAMUSCULAR | Status: AC
  Administered 2020-11-08: 2.5 mg via INTRAVENOUS
  Filled 2020-11-08: qty 2

## 2020-11-08 MED ORDER — MORPHINE SULFATE (PF) 4 MG/ML IV SOLN
4.0000 mg | Freq: Once | INTRAVENOUS | Status: AC
Start: 2020-11-08 — End: 2020-11-08
  Administered 2020-11-08: 4 mg via INTRAVENOUS
  Filled 2020-11-08: qty 1

## 2020-11-08 MED ORDER — ONDANSETRON HCL 4 MG/2ML IJ SOLN
4.0000 mg | Freq: Once | INTRAMUSCULAR | Status: AC
  Administered 2020-11-09: 4 mg via INTRAVENOUS
  Filled 2020-11-08: qty 2

## 2020-11-08 MED ORDER — ONDANSETRON HCL 4 MG/2ML IJ SOLN
4.0000 mg | Freq: Once | INTRAMUSCULAR | Status: AC
  Administered 2020-11-08: 4 mg via INTRAVENOUS
  Filled 2020-11-08: qty 2

## 2020-11-08 MED ORDER — SODIUM CHLORIDE 0.9 % IV BOLUS
1000.0000 mL | Freq: Once | INTRAVENOUS | Status: AC
  Administered 2020-11-08: 1000 mL via INTRAVENOUS

## 2020-11-08 MED ORDER — HYDROMORPHONE HCL 1 MG/ML IJ SOLN
1.0000 mg | Freq: Once | INTRAMUSCULAR | Status: AC
Start: 2020-11-08 — End: 2020-11-08
  Administered 2020-11-08: 1 mg via INTRAVENOUS
  Filled 2020-11-08: qty 1
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
## 2020-11-08 NOTE — ED Notes (Signed)
Pt to CT

## 2020-11-08 NOTE — ED Triage Notes (Addendum)
C/o abd pain with n/v x 1 day. Pt reports excessive aleve, motrin , Asprin , Vicodin, tramadol and etoh

## 2020-11-08 NOTE — ED Notes (Signed)
Pt advised  of CT scan

## 2020-11-09 ENCOUNTER — Encounter (HOSPITAL_COMMUNITY)
Admission: EM | Disposition: A | Discharge status: Home / Self Care | Payer: Self-pay | Source: Home / Self Care | Attending: Internal Medicine

## 2020-11-09 ENCOUNTER — Encounter (HOSPITAL_COMMUNITY): Payer: Self-pay | Admitting: Internal Medicine

## 2020-11-09 ENCOUNTER — Observation Stay (HOSPITAL_COMMUNITY): Payer: 59 | Admitting: Anesthesiology

## 2020-11-09 DIAGNOSIS — K920 Hematemesis: Secondary | ICD-10-CM | POA: Diagnosis not present

## 2020-11-09 DIAGNOSIS — K226 Gastro-esophageal laceration-hemorrhage syndrome: Secondary | ICD-10-CM | POA: Diagnosis not present

## 2020-11-09 DIAGNOSIS — K449 Diaphragmatic hernia without obstruction or gangrene: Secondary | ICD-10-CM | POA: Diagnosis not present

## 2020-11-09 DIAGNOSIS — F1010 Alcohol abuse, uncomplicated: Secondary | ICD-10-CM | POA: Diagnosis not present

## 2020-11-09 DIAGNOSIS — K2970 Gastritis, unspecified, without bleeding: Secondary | ICD-10-CM | POA: Diagnosis not present

## 2020-11-09 DIAGNOSIS — K2980 Duodenitis without bleeding: Secondary | ICD-10-CM | POA: Diagnosis not present

## 2020-11-09 DIAGNOSIS — D72829 Elevated white blood cell count, unspecified: Secondary | ICD-10-CM | POA: Diagnosis not present

## 2020-11-09 DIAGNOSIS — Z20822 Contact with and (suspected) exposure to covid-19: Secondary | ICD-10-CM | POA: Diagnosis not present

## 2020-11-09 DIAGNOSIS — F1290 Cannabis use, unspecified, uncomplicated: Secondary | ICD-10-CM | POA: Diagnosis not present

## 2020-11-09 DIAGNOSIS — F1490 Cocaine use, unspecified, uncomplicated: Secondary | ICD-10-CM | POA: Diagnosis not present

## 2020-11-09 DIAGNOSIS — K269 Duodenal ulcer, unspecified as acute or chronic, without hemorrhage or perforation: Secondary | ICD-10-CM | POA: Diagnosis not present

## 2020-11-09 DIAGNOSIS — F1920 Other psychoactive substance dependence, uncomplicated: Secondary | ICD-10-CM

## 2020-11-09 DIAGNOSIS — Z8719 Personal history of other diseases of the digestive system: Secondary | ICD-10-CM | POA: Diagnosis not present

## 2020-11-09 DIAGNOSIS — F1120 Opioid dependence, uncomplicated: Secondary | ICD-10-CM | POA: Diagnosis not present

## 2020-11-09 DIAGNOSIS — K259 Gastric ulcer, unspecified as acute or chronic, without hemorrhage or perforation: Secondary | ICD-10-CM

## 2020-11-09 DIAGNOSIS — Z7982 Long term (current) use of aspirin: Secondary | ICD-10-CM | POA: Diagnosis not present

## 2020-11-09 DIAGNOSIS — K922 Gastrointestinal hemorrhage, unspecified: Secondary | ICD-10-CM

## 2020-11-09 DIAGNOSIS — K2990 Gastroduodenitis, unspecified, without bleeding: Secondary | ICD-10-CM | POA: Diagnosis not present

## 2020-11-09 DIAGNOSIS — S82402D Unspecified fracture of shaft of left fibula, subsequent encounter for closed fracture with routine healing: Secondary | ICD-10-CM

## 2020-11-09 DIAGNOSIS — R109 Unspecified abdominal pain: Secondary | ICD-10-CM | POA: Diagnosis present

## 2020-11-09 DIAGNOSIS — K250 Acute gastric ulcer with hemorrhage: Secondary | ICD-10-CM | POA: Diagnosis not present

## 2020-11-09 DIAGNOSIS — Z791 Long term (current) use of non-steroidal anti-inflammatories (NSAID): Secondary | ICD-10-CM | POA: Diagnosis not present

## 2020-11-09 DIAGNOSIS — F1910 Other psychoactive substance abuse, uncomplicated: Secondary | ICD-10-CM | POA: Diagnosis not present

## 2020-11-09 DIAGNOSIS — Z79899 Other long term (current) drug therapy: Secondary | ICD-10-CM | POA: Diagnosis not present

## 2020-11-09 DIAGNOSIS — S82852A Displaced trimalleolar fracture of left lower leg, initial encounter for closed fracture: Secondary | ICD-10-CM | POA: Diagnosis not present

## 2020-11-09 DIAGNOSIS — S82202D Unspecified fracture of shaft of left tibia, subsequent encounter for closed fracture with routine healing: Secondary | ICD-10-CM

## 2020-11-09 DIAGNOSIS — K209 Esophagitis, unspecified without bleeding: Secondary | ICD-10-CM | POA: Diagnosis not present

## 2020-11-09 HISTORY — PX: ESOPHAGOGASTRODUODENOSCOPY (EGD) WITH PROPOFOL: SHX5813

## 2020-11-09 LAB — CBC
HCT: 41.4 % (ref 39.0–52.0)
Hemoglobin: 14.1 g/dL (ref 13.0–17.0)
MCH: 29.9 pg (ref 26.0–34.0)
MCHC: 34.1 g/dL (ref 30.0–36.0)
MCV: 87.7 fL (ref 80.0–100.0)
Platelets: 337 10*3/uL (ref 150–400)
RBC: 4.72 MIL/uL (ref 4.22–5.81)
RDW: 13.2 % (ref 11.5–15.5)
WBC: 13.6 10*3/uL — ABNORMAL HIGH (ref 4.0–10.5)
nRBC: 0 % (ref 0.0–0.2)

## 2020-11-09 LAB — MRSA PCR SCREENING: MRSA by PCR: POSITIVE — AB

## 2020-11-09 LAB — HIV ANTIBODY (ROUTINE TESTING W REFLEX): HIV Screen 4th Generation wRfx: NONREACTIVE

## 2020-11-09 SURGERY — ESOPHAGOGASTRODUODENOSCOPY (EGD) WITH PROPOFOL
Anesthesia: Monitor Anesthesia Care

## 2020-11-09 MED ORDER — ONDANSETRON HCL 4 MG/2ML IJ SOLN
4.0000 mg | Freq: Four times a day (QID) | INTRAMUSCULAR | Status: DC | PRN
  Administered 2020-11-09 – 2020-11-10 (×3): 4 mg via INTRAVENOUS
  Filled 2020-11-09 (×3): qty 2

## 2020-11-09 MED ORDER — KETAMINE HCL 50 MG/5ML IJ SOSY
PREFILLED_SYRINGE | INTRAMUSCULAR | Status: AC
  Filled 2020-11-09: qty 5

## 2020-11-09 MED ORDER — PROPOFOL 10 MG/ML IV BOLUS
INTRAVENOUS | Status: DC | PRN
  Administered 2020-11-09: 30 mg via INTRAVENOUS
  Administered 2020-11-09 (×3): 20 mg via INTRAVENOUS

## 2020-11-09 MED ORDER — THIAMINE HCL 100 MG/ML IJ SOLN
100.0000 mg | Freq: Every day | INTRAMUSCULAR | Status: DC
  Administered 2020-11-09 – 2020-11-10 (×2): 100 mg via INTRAVENOUS
  Filled 2020-11-09 (×2): qty 2

## 2020-11-09 MED ORDER — LACTATED RINGERS IV SOLN: INTRAVENOUS | Status: DC

## 2020-11-09 MED ORDER — HYDRALAZINE HCL 20 MG/ML IJ SOLN: 5.0000 mg | INTRAMUSCULAR | Status: DC | PRN

## 2020-11-09 MED ORDER — FOLIC ACID 1 MG PO TABS
1.0000 mg | ORAL_TABLET | Freq: Every day | ORAL | Status: DC
  Administered 2020-11-10 – 2020-11-11 (×2): 1 mg via ORAL
  Filled 2020-11-09 (×2): qty 1

## 2020-11-09 MED ORDER — DEXMEDETOMIDINE (PRECEDEX) IN NS 20 MCG/5ML (4 MCG/ML) IV SYRINGE
PREFILLED_SYRINGE | INTRAVENOUS | Status: AC
  Filled 2020-11-09: qty 10

## 2020-11-09 MED ORDER — ACETAMINOPHEN 650 MG RE SUPP: 650.0000 mg | Freq: Four times a day (QID) | RECTAL | Status: DC | PRN

## 2020-11-09 MED ORDER — ONDANSETRON HCL 4 MG PO TABS: 4.0000 mg | ORAL_TABLET | Freq: Four times a day (QID) | ORAL | Status: DC | PRN

## 2020-11-09 MED ORDER — THIAMINE HCL 100 MG PO TABS
100.0000 mg | ORAL_TABLET | Freq: Every day | ORAL | Status: DC
  Administered 2020-11-11: 100 mg via ORAL
  Filled 2020-11-09: qty 1

## 2020-11-09 MED ORDER — LORAZEPAM 1 MG PO TABS: 1.0000 mg | ORAL_TABLET | ORAL | Status: DC | PRN

## 2020-11-09 MED ORDER — ADULT MULTIVITAMIN W/MINERALS CH
1.0000 | ORAL_TABLET | Freq: Every day | ORAL | Status: DC
  Administered 2020-11-10 – 2020-11-11 (×2): 1 via ORAL
  Filled 2020-11-09 (×2): qty 1

## 2020-11-09 MED ORDER — PANTOPRAZOLE SODIUM 40 MG IV SOLR
40.0000 mg | Freq: Two times a day (BID) | INTRAVENOUS | Status: DC
  Administered 2020-11-09 – 2020-11-11 (×4): 40 mg via INTRAVENOUS
  Filled 2020-11-09 (×4): qty 40

## 2020-11-09 MED ORDER — LIDOCAINE 2% (20 MG/ML) 5 ML SYRINGE
INTRAMUSCULAR | Status: DC | PRN
  Administered 2020-11-09: 60 mg via INTRAVENOUS

## 2020-11-09 MED ORDER — LACTATED RINGERS IV SOLN
INTRAVENOUS | Status: AC | PRN
  Administered 2020-11-09: 20 mL/h via INTRAVENOUS

## 2020-11-09 MED ORDER — HYDROXYZINE HCL 50 MG/ML IM SOLN
25.0000 mg | Freq: Four times a day (QID) | INTRAMUSCULAR | Status: DC | PRN
  Administered 2020-11-09: 25 mg via INTRAMUSCULAR
  Filled 2020-11-09 (×2): qty 0.5

## 2020-11-09 MED ORDER — DROPERIDOL 2.5 MG/ML IJ SOLN
2.5000 mg | Freq: Once | INTRAMUSCULAR | Status: AC | PRN
  Administered 2020-11-09: 2.5 mg via INTRAVENOUS
  Filled 2020-11-09: qty 2

## 2020-11-09 MED ORDER — LORAZEPAM 2 MG/ML IJ SOLN
1.0000 mg | INTRAMUSCULAR | Status: DC | PRN
  Administered 2020-11-09 – 2020-11-11 (×6): 2 mg via INTRAVENOUS
  Filled 2020-11-09 (×7): qty 1

## 2020-11-09 MED ORDER — METHOCARBAMOL 1000 MG/10ML IJ SOLN
500.0000 mg | Freq: Four times a day (QID) | INTRAVENOUS | Status: DC | PRN
  Filled 2020-11-09: qty 5

## 2020-11-09 MED ORDER — DEXMEDETOMIDINE (PRECEDEX) IN NS 20 MCG/5ML (4 MCG/ML) IV SYRINGE
PREFILLED_SYRINGE | INTRAVENOUS | Status: DC | PRN
  Administered 2020-11-09 (×3): 4 ug via INTRAVENOUS

## 2020-11-09 MED ORDER — MIDAZOLAM HCL 2 MG/2ML IJ SOLN
INTRAMUSCULAR | Status: DC | PRN
  Administered 2020-11-09: 2 mg via INTRAVENOUS

## 2020-11-09 MED ORDER — ACETAMINOPHEN 325 MG PO TABS
650.0000 mg | ORAL_TABLET | Freq: Four times a day (QID) | ORAL | Status: DC | PRN
  Administered 2020-11-11 (×2): 650 mg via ORAL
  Filled 2020-11-09 (×2): qty 2

## 2020-11-09 MED ORDER — PANTOPRAZOLE SODIUM 40 MG IV SOLR
INTRAVENOUS | Status: AC
  Administered 2020-11-09: 80 mg
  Filled 2020-11-09: qty 80

## 2020-11-09 MED ORDER — MUPIROCIN 2 % EX OINT
1.0000 "application " | TOPICAL_OINTMENT | Freq: Two times a day (BID) | CUTANEOUS | Status: DC
  Administered 2020-11-09 – 2020-11-11 (×4): 1 via NASAL
  Filled 2020-11-09: qty 22

## 2020-11-09 MED ORDER — PROPOFOL 500 MG/50ML IV EMUL
INTRAVENOUS | Status: DC | PRN
  Administered 2020-11-09: 75 ug/kg/min via INTRAVENOUS

## 2020-11-09 MED ORDER — MORPHINE SULFATE (PF) 2 MG/ML IV SOLN: 2.0000 mg | INTRAVENOUS | Status: DC | PRN

## 2020-11-09 MED ORDER — PANTOPRAZOLE SODIUM 40 MG PO TBEC
40.0000 mg | DELAYED_RELEASE_TABLET | Freq: Two times a day (BID) | ORAL | Status: DC
  Filled 2020-11-09: qty 1

## 2020-11-09 SURGICAL SUPPLY — 14 items

## 2020-11-09 NOTE — Anesthesia Preprocedure Evaluation (Signed)
Anesthesia Evaluation  Patient identified by MRN, date of birth, ID band Patient awake    Reviewed: Allergy & Precautions, NPO status , Patient's Chart, lab work & pertinent test results  Airway Mallampati: I  TM Distance: >3 FB Neck ROM: Full    Dental  (+) Teeth Intact, Dental Advisory Given   Pulmonary Patient abstained from smoking.,    breath sounds clear to auscultation       Cardiovascular  Rhythm:Regular Rate:Normal     Neuro/Psych    GI/Hepatic   Endo/Other    Renal/GU      Musculoskeletal   Abdominal   Peds  Hematology   Anesthesia Other Findings   Reproductive/Obstetrics                             Anesthesia Physical Anesthesia Plan  ASA: II  Anesthesia Plan: MAC   Post-op Pain Management:    Induction: Intravenous  PONV Risk Score and Plan: Ondansetron and Propofol infusion  Airway Management Planned: Natural Airway and Nasal Cannula  Additional Equipment:   Intra-op Plan:   Post-operative Plan:   Informed Consent: I have reviewed the patients History and Physical, chart, labs and discussed the procedure including the risks, benefits and alternatives for the proposed anesthesia with the patient or authorized representative who has indicated his/her understanding and acceptance.     Dental advisory given  Plan Discussed with: Surgeon, Anesthesiologist and CRNA  Anesthesia Plan Comments:         Anesthesia Quick Evaluation

## 2020-11-09 NOTE — Progress Notes (Signed)
Patient was vomiting for 20 min (Brown color with mucous stuff, less than 100 ml this time). HR went up to 100's when he vomited then went back down to 70's to 90's. He c/o headache and feel hot & cold. He stated that he had cocaine on Wednesday. Administered vistaril 25 mg IM for nausea & vomiting and Ativan 2 mg IV for CIWA. Applied O2 3L via [place] for SOB. Patient is trying to rest now. Changed diet to NPO due to N/V at this time. HS [employer]

## 2020-11-09 NOTE — Anesthesia Procedure Notes (Signed)
Procedure Name: MAC Date/Time: 11/09/2020 2:34 PM Performed by: Trinna Post., CRNA Pre-anesthesia Checklist: Patient identified, Emergency Drugs available, Suction available and Patient being monitored Patient Re-evaluated:Patient Re-evaluated prior to induction Oxygen Delivery Method: Nasal cannula Preoxygenation: Pre-oxygenation with 100% oxygen Induction Type: IV induction Placement Confirmation: positive ETCO2 and CO2 detector Dental Injury: Teeth and Oropharynx as per pre-operative assessment

## 2020-11-09 NOTE — ED Notes (Signed)
Pt put on cardiac monitor per orders Pt given update - has call bell, and TV remote

## 2020-11-09 NOTE — Anesthesia Postprocedure Evaluation (Signed)
Anesthesia Post Note  Patient: Jason Cruz  Procedure(s) Performed: ESOPHAGOGASTRODUODENOSCOPY (EGD) WITH PROPOFOL (N/A )     Patient location during evaluation: Endoscopy Anesthesia Type: MAC Level of consciousness: awake and alert Pain management: pain level controlled Vital Signs Assessment: post-procedure vital signs reviewed and stable Respiratory status: spontaneous breathing, nonlabored ventilation, respiratory function stable and patient connected to nasal cannula oxygen Cardiovascular status: stable and blood pressure returned to baseline Postop Assessment: no apparent nausea or vomiting Anesthetic complications: no   No complications documented.  Last Vitals:  Vitals:   11/09/20 1503 11/09/20 1516  BP: 112/67 105/76  Pulse: 69 73  Resp: (!) 23 16  Temp:  36.8 C  SpO2: 99% 98%    Last Pain:  Vitals:   11/09/20 1516  TempSrc: Oral  PainSc: 0-No pain                 Kaidon Kinker COKER
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
## 2020-11-09 NOTE — ED Notes (Signed)
Report given to Ms Jesus @ [facility] - going to bed #3

## 2020-11-09 NOTE — Transfer of Care (Signed)
Immediate Anesthesia Transfer of Care Note  Patient: Jason Cruz  Procedure(s) Performed: ESOPHAGOGASTRODUODENOSCOPY (EGD) WITH PROPOFOL (N/A )  Patient Location: Endoscopy Unit  Anesthesia Type:MAC  Level of Consciousness: awake, alert , oriented and drowsy  Airway & Oxygen Therapy: Patient Spontanous Breathing  Post-op Assessment: Report given to RN, Post -op Vital signs reviewed and stable and Patient moving all extremities  Post vital signs: Reviewed and stable  Last Vitals:  Vitals Value Taken Time  BP 112/68 11/09/20 1453  Temp    Pulse 95 11/09/20 1453  Resp 15 11/09/20 1453  SpO2 98 % 11/09/20 1453    Last Pain:  Vitals:   11/09/20 1453  TempSrc:   PainSc: 0-No pain         Complications: No complications documented.

## 2020-11-09 NOTE — Op Note (Signed)
[facility] Name: Jason BucyJoel Brandner Procedure Date : 11/09/2020 MRN: [id number] Attending MD: Napoleon FormKavitha V. Deziah Renwick , MD Date of Birth: 1996/06/01 CSN: [id number] Age: [age] Admit Type: Inpatient Procedure:                Upper GI endoscopy Indications:              Recent gastrointestinal bleeding, Suspected upper                            gastrointestinal bleeding, Coffee-ground emesis,                            Hematemesis Providers:                Napoleon FormKavitha V. Marvena Tally, MD, Fayrene FearingLaura Turner, RN, Brion AlimentShayla                            Proctor, Technician Referring MD:              Medicines:                Monitored Anesthesia Care Complications:            No immediate complications. Estimated Blood Loss:     Estimated blood loss was minimal. Procedure:                Pre-Anesthesia Assessment:                           - Prior to the procedure, a History and Physical                            was performed, and patient medications and                            allergies were reviewed. The patient's tolerance of                            previous anesthesia was also reviewed. The risks                            and benefits of the procedure and the sedation                            options and risks were discussed with the patient.                            All questions were answered, and informed consent                            was obtained. Prior Anticoagulants: The patient has                            taken no previous anticoagulant or antiplatelet                            agents. ASA Grade Assessment: II -  A patient with                            mild systemic disease. After reviewing the risks                            and benefits, the patient was deemed in                            satisfactory condition to undergo the procedure.                           After obtaining informed consent, the endoscope was                            passed under direct  vision. Throughout the                            procedure, the patient's blood pressure, pulse, and                            oxygen saturations were monitored continuously. The                            GIF-H190 ([id number]) Olympus gastroscope was                            introduced through the mouth, and advanced to the                            second part of duodenum. The upper GI endoscopy was                            accomplished without difficulty. The patient                            tolerated the procedure well. Scope In: Scope Out: Findings:      A 3 cm hiatal hernia was present.      A less than 1 mm non-bleeding Mallory-Weiss tear with no stigmata of       recent bleeding was found.      Few non-bleeding linear and superficial gastric ulcers with no stigmata       of bleeding were found in the cardia. The largest lesion was 5 mm in       largest dimension.      Patchy mild inflammation characterized by congestion (edema), erosions       and erythema was found in the entire examined stomach. Biopsies were       taken with a cold forceps for Helicobacter pylori testing.      Patchy mild inflammation characterized by congestion (edema), erosions,       erythema and linear erosions was found in the duodenal bulb. Impression:               - 3 cm hiatal hernia.                           -  Mallory-Weiss tear.                           - Non-bleeding gastric ulcers with no stigmata of                            bleeding.                           - Gastritis. Biopsied.                           - Duodenitis. Recommendation:           - Patient has a contact number available for                            emergencies. The signs and symptoms of potential                            delayed complications were discussed with the                            patient. Return to normal activities tomorrow.                            Written discharge instructions were provided to  the                            patient.                           - Resume previous diet.                           - Continue present medications.                           - Await pathology results.                           - Use Protonix (pantoprazole) 40 mg PO BID.                           - Follow an antireflux regimen.                           - Avoid NSAID's                           - GI will sign off, please call with any questions Procedure Code(s):        --- Professional ---                           [phone], Esophagogastroduodenoscopy, flexible,                            transoral; with biopsy, single or multiple Diagnosis Code(s):        ---  Professional ---                           K22.6, Gastro-esophageal laceration-hemorrhage                            syndrome                           K25.9, Gastric ulcer, unspecified as acute or                            chronic, without hemorrhage or perforation                           K29.70, Gastritis, unspecified, without bleeding                           K29.80, Duodenitis without bleeding                           K92.2, Gastrointestinal hemorrhage, unspecified                           K92.0, Hematemesis CPT copyright 2019 [employer]. All rights reserved. The codes documented in this report are preliminary and upon coder review may  be revised to meet current compliance requirements. Napoleon Form, MD 11/09/2020 2:59:44 PM This report has been signed electronically. Number of Addenda: 0

## 2020-11-09 NOTE — ED Notes (Addendum)
RN Ms Nickolas Madrid unable to receive report at this time and will call back

## 2020-11-09 NOTE — Progress Notes (Signed)
Patient c/o nausea then one time vomited. Charge nurse administered zofran IV. Patient vomited x 1 (brown emesis with mucous)  again with headache. Paging Dr. Ophelia Charter and awaiting for phone call. HS [employer]
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
## 2020-11-10 DIAGNOSIS — K2921 Alcoholic gastritis with bleeding: Secondary | ICD-10-CM | POA: Diagnosis not present

## 2020-11-10 DIAGNOSIS — Z791 Long term (current) use of non-steroidal anti-inflammatories (NSAID): Secondary | ICD-10-CM | POA: Diagnosis not present

## 2020-11-10 DIAGNOSIS — K2970 Gastritis, unspecified, without bleeding: Secondary | ICD-10-CM | POA: Diagnosis not present

## 2020-11-10 DIAGNOSIS — K226 Gastro-esophageal laceration-hemorrhage syndrome: Secondary | ICD-10-CM | POA: Diagnosis present

## 2020-11-10 DIAGNOSIS — K250 Acute gastric ulcer with hemorrhage: Secondary | ICD-10-CM | POA: Diagnosis not present

## 2020-11-10 DIAGNOSIS — Z20822 Contact with and (suspected) exposure to covid-19: Secondary | ICD-10-CM | POA: Diagnosis not present

## 2020-11-10 DIAGNOSIS — F1120 Opioid dependence, uncomplicated: Secondary | ICD-10-CM | POA: Diagnosis not present

## 2020-11-10 DIAGNOSIS — K922 Gastrointestinal hemorrhage, unspecified: Secondary | ICD-10-CM | POA: Diagnosis present

## 2020-11-10 DIAGNOSIS — F1490 Cocaine use, unspecified, uncomplicated: Secondary | ICD-10-CM | POA: Diagnosis not present

## 2020-11-10 DIAGNOSIS — Z79899 Other long term (current) drug therapy: Secondary | ICD-10-CM | POA: Diagnosis not present

## 2020-11-10 DIAGNOSIS — K2990 Gastroduodenitis, unspecified, without bleeding: Secondary | ICD-10-CM | POA: Diagnosis present

## 2020-11-10 DIAGNOSIS — S82852A Displaced trimalleolar fracture of left lower leg, initial encounter for closed fracture: Secondary | ICD-10-CM | POA: Diagnosis not present

## 2020-11-10 DIAGNOSIS — F1010 Alcohol abuse, uncomplicated: Secondary | ICD-10-CM | POA: Diagnosis not present

## 2020-11-10 DIAGNOSIS — Z8719 Personal history of other diseases of the digestive system: Secondary | ICD-10-CM | POA: Diagnosis not present

## 2020-11-10 DIAGNOSIS — K2980 Duodenitis without bleeding: Secondary | ICD-10-CM | POA: Diagnosis present

## 2020-11-10 DIAGNOSIS — K209 Esophagitis, unspecified without bleeding: Secondary | ICD-10-CM | POA: Diagnosis present

## 2020-11-10 DIAGNOSIS — K269 Duodenal ulcer, unspecified as acute or chronic, without hemorrhage or perforation: Secondary | ICD-10-CM | POA: Diagnosis present

## 2020-11-10 DIAGNOSIS — R109 Unspecified abdominal pain: Secondary | ICD-10-CM | POA: Diagnosis present

## 2020-11-10 DIAGNOSIS — F1290 Cannabis use, unspecified, uncomplicated: Secondary | ICD-10-CM | POA: Diagnosis present

## 2020-11-10 DIAGNOSIS — K449 Diaphragmatic hernia without obstruction or gangrene: Secondary | ICD-10-CM | POA: Diagnosis present

## 2020-11-10 DIAGNOSIS — D72829 Elevated white blood cell count, unspecified: Secondary | ICD-10-CM | POA: Diagnosis present

## 2020-11-10 DIAGNOSIS — Z7982 Long term (current) use of aspirin: Secondary | ICD-10-CM | POA: Diagnosis not present

## 2020-11-10 DIAGNOSIS — F1910 Other psychoactive substance abuse, uncomplicated: Secondary | ICD-10-CM | POA: Diagnosis not present

## 2020-11-10 LAB — CBC
HCT: 39.8 % (ref 39.0–52.0)
HCT: 40.1 % (ref 39.0–52.0)
Hemoglobin: 13 g/dL (ref 13.0–17.0)
Hemoglobin: 13.4 g/dL (ref 13.0–17.0)
MCH: 29.4 pg (ref 26.0–34.0)
MCH: 29.6 pg (ref 26.0–34.0)
MCHC: 32.7 g/dL (ref 30.0–36.0)
MCHC: 33.4 g/dL (ref 30.0–36.0)
MCV: 90 fL (ref 80.0–100.0)
MCV: 88.5 fL (ref 80.0–100.0)
Platelets: 296 10*3/uL (ref 150–400)
Platelets: 298 10*3/uL (ref 150–400)
RBC: 4.42 MIL/uL (ref 4.22–5.81)
RBC: 4.53 MIL/uL (ref 4.22–5.81)
RDW: 13.3 % (ref 11.5–15.5)
RDW: 13 % (ref 11.5–15.5)
WBC: 11.3 10*3/uL — ABNORMAL HIGH (ref 4.0–10.5)
WBC: 10.4 10*3/uL (ref 4.0–10.5)
nRBC: 0 % (ref 0.0–0.2)
nRBC: 0 % (ref 0.0–0.2)

## 2020-11-10 LAB — BASIC METABOLIC PANEL
Anion gap: 8 (ref 5–15)
BUN: 13 mg/dL (ref 6–20)
CO2: 23 mmol/L (ref 22–32)
Calcium: 9.2 mg/dL (ref 8.9–10.3)
Chloride: 107 mmol/L (ref 98–111)
Creatinine, Ser: 0.81 mg/dL (ref 0.61–1.24)
GFR, Estimated: >60 mL/min (ref >60)
Glucose, Bld: 92 mg/dL (ref 70–99)
Potassium: 3.6 mmol/L (ref 3.5–5.1)
Sodium: 138 mmol/L (ref 135–145)

## 2020-11-10 MED ORDER — HALOPERIDOL LACTATE 5 MG/ML IJ SOLN: 2.0000 mg | Freq: Four times a day (QID) | INTRAMUSCULAR | Status: DC | PRN

## 2020-11-10 MED ORDER — MORPHINE SULFATE (PF) 2 MG/ML IV SOLN
1.0000 mg | INTRAVENOUS | Status: DC | PRN
Start: 2020-11-10 — End: 2020-11-10

## 2020-11-10 MED ORDER — CAPSAICIN 0.025 % EX CREA
TOPICAL_CREAM | Freq: Two times a day (BID) | CUTANEOUS | Status: DC
  Filled 2020-11-10: qty 60
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
## 2020-11-11 ENCOUNTER — Encounter (HOSPITAL_COMMUNITY): Payer: Self-pay | Admitting: Gastroenterology

## 2020-11-11 ENCOUNTER — Other Ambulatory Visit (HOSPITAL_COMMUNITY): Payer: Self-pay

## 2020-11-11 LAB — BASIC METABOLIC PANEL
Anion gap: 9 (ref 5–15)
BUN: 11 mg/dL (ref 6–20)
CO2: 23 mmol/L (ref 22–32)
Calcium: 9.2 mg/dL (ref 8.9–10.3)
Chloride: 105 mmol/L (ref 98–111)
Creatinine, Ser: 0.82 mg/dL (ref 0.61–1.24)
GFR, Estimated: >60 mL/min (ref >60)
Glucose, Bld: 101 mg/dL — ABNORMAL HIGH (ref 70–99)
Potassium: 3.5 mmol/L (ref 3.5–5.1)
Sodium: 137 mmol/L (ref 135–145)

## 2020-11-11 LAB — CBC
HCT: 39.5 % (ref 39.0–52.0)
Hemoglobin: 13 g/dL (ref 13.0–17.0)
MCH: 29.3 pg (ref 26.0–34.0)
MCHC: 32.9 g/dL (ref 30.0–36.0)
MCV: 89 fL (ref 80.0–100.0)
Platelets: 276 10*3/uL (ref 150–400)
RBC: 4.44 MIL/uL (ref 4.22–5.81)
RDW: 13 % (ref 11.5–15.5)
WBC: 9.2 10*3/uL (ref 4.0–10.5)
nRBC: 0 % (ref 0.0–0.2)

## 2020-11-11 MED ORDER — THIAMINE HCL 100 MG PO TABS: 100.0000 mg | ORAL_TABLET | Freq: Every day | ORAL | Status: AC

## 2020-11-11 MED ORDER — PANTOPRAZOLE SODIUM 40 MG PO TBEC
40.0000 mg | DELAYED_RELEASE_TABLET | Freq: Two times a day (BID) | ORAL | 1 refills | Status: DC
  Filled 2020-11-11: qty 60, 30d supply, fill #0

## 2020-11-11 MED ORDER — FOLIC ACID 1 MG PO TABS: 1.0000 mg | ORAL_TABLET | Freq: Every day | ORAL | Status: AC

## 2020-11-11 NOTE — TOC Progression Note (Signed)
Transition of Care [facility]) - Progression Note    Patient Details  Name: Jason Cruz MRN: [id number] Date of Birth: 12-29-1995  Transition of Care [facility]) CM/SW Contact  Ivette Loyal, [place] Phone Number: 11/11/2020, 12:12 PM  Clinical Narrative:    10:50am- CSW spoke with pt about substance abuse, pt stated he has a history of alcohol abuse and has been trying to quit drinking for about 3 years. He says he no longer drinks liquor but will drink a few tall cans of beer a week unless he parties then he will drink more. He also stated that he uses cocaine occasionally when partying, pt states that he does not have a opoid addiction and he only takes what's prescribed by the doctor. CSW provided inpatient and outpatient resources for alcohol and substance use in [place] and surrounding areas.  Pt accepted resources stating he will look into alcohol treatment, pt will dc today. CSW signing off.        Expected Discharge Plan and Services           Expected Discharge Date: 11/11/20                                     Social Determinants of Health (SDOH) Interventions    Readmission Risk Interventions No flowsheet data found.

## 2020-11-11 NOTE — Progress Notes (Signed)
1 IV removed from right AC, site is clean dry and intact. Discharge instructions given to patient, no questions at this time.

## 2020-11-11 NOTE — Plan of Care (Signed)
  Problem: Education: Goal: Ability to identify signs and symptoms of gastrointestinal bleeding will improve Outcome: Progressing   Problem: Bowel/Gastric: Goal: Will show no signs and symptoms of gastrointestinal bleeding Outcome: Progressing   Problem: Fluid Volume: Goal: Will show no signs and symptoms of excessive bleeding Outcome: Progressing   Problem: Clinical Measurements: Goal: Complications related to the disease process, condition or treatment will be avoided or minimized Outcome: Progressing   Problem: Health Behavior/Discharge Planning: Goal: Ability to manage health-related needs will improve Outcome: Progressing   Problem: Clinical Measurements: Goal: Ability to maintain clinical measurements within normal limits will improve Outcome: Progressing Goal: Will remain free from infection Outcome: Progressing Goal: Diagnostic test results will improve Outcome: Progressing Goal: Respiratory complications will improve Outcome: Progressing Goal: Cardiovascular complication will be avoided Outcome: Progressing   Problem: Activity: Goal: Risk for activity intolerance will decrease Outcome: Progressing   Problem: Nutrition: Goal: Adequate nutrition will be maintained Outcome: Progressing   Problem: Coping: Goal: Level of anxiety will decrease Outcome: Progressing   Problem: Pain Managment: Goal: General experience of comfort will improve Outcome: Progressing   Problem: Safety: Goal: Ability to remain free from injury will improve Outcome: Progressing   Problem: Skin Integrity: Goal: Risk for impaired skin integrity will decrease Outcome: Progressing
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
## 2020-11-12 ENCOUNTER — Encounter: Payer: Self-pay | Admitting: Gastroenterology

## 2020-11-12 LAB — SURGICAL PATHOLOGY

## 2020-12-20 ENCOUNTER — Emergency Department (HOSPITAL_BASED_OUTPATIENT_CLINIC_OR_DEPARTMENT_OTHER)
Admission: EM | Admit: 2020-12-20 | Discharge: 2020-12-20 | Disposition: A | Discharge status: Home / Self Care | Payer: 59 | Attending: Emergency Medicine | Admitting: Emergency Medicine

## 2020-12-20 ENCOUNTER — Encounter (HOSPITAL_BASED_OUTPATIENT_CLINIC_OR_DEPARTMENT_OTHER): Payer: Self-pay | Admitting: *Deleted

## 2020-12-20 ENCOUNTER — Other Ambulatory Visit: Payer: Self-pay

## 2020-12-20 ENCOUNTER — Emergency Department (HOSPITAL_BASED_OUTPATIENT_CLINIC_OR_DEPARTMENT_OTHER): Payer: 59

## 2020-12-20 DIAGNOSIS — R000 Tachycardia, unspecified: Secondary | ICD-10-CM | POA: Diagnosis not present

## 2020-12-20 DIAGNOSIS — R109 Unspecified abdominal pain: Secondary | ICD-10-CM | POA: Diagnosis not present

## 2020-12-20 DIAGNOSIS — R319 Hematuria, unspecified: Secondary | ICD-10-CM | POA: Diagnosis not present

## 2020-12-20 DIAGNOSIS — R112 Nausea with vomiting, unspecified: Secondary | ICD-10-CM | POA: Diagnosis present

## 2020-12-20 DIAGNOSIS — Z20822 Contact with and (suspected) exposure to covid-19: Secondary | ICD-10-CM | POA: Insufficient documentation

## 2020-12-20 DIAGNOSIS — R197 Diarrhea, unspecified: Secondary | ICD-10-CM | POA: Insufficient documentation

## 2020-12-20 HISTORY — DX: Alcohol abuse, uncomplicated: F10.10

## 2020-12-20 LAB — COMPREHENSIVE METABOLIC PANEL
ALT: 24 U/L (ref 0–44)
AST: 24 U/L (ref 15–41)
Albumin: 5 g/dL (ref 3.5–5.0)
Alkaline Phosphatase: 63 U/L (ref 38–126)
Anion gap: 11 (ref 5–15)
BUN: 20 mg/dL (ref 6–20)
CO2: 22 mmol/L (ref 22–32)
Calcium: 9.6 mg/dL (ref 8.9–10.3)
Chloride: 102 mmol/L (ref 98–111)
Creatinine, Ser: 0.98 mg/dL (ref 0.61–1.24)
GFR, Estimated: >60 mL/min (ref >60)
Glucose, Bld: 121 mg/dL — ABNORMAL HIGH (ref 70–99)
Potassium: 3.9 mmol/L (ref 3.5–5.1)
Sodium: 135 mmol/L (ref 135–145)
Total Bilirubin: 0.9 mg/dL (ref 0.3–1.2)
Total Protein: 7.9 g/dL (ref 6.5–8.1)

## 2020-12-20 LAB — CBC WITH DIFFERENTIAL/PLATELET
Abs Immature Granulocytes: 0.1 10*3/uL — ABNORMAL HIGH (ref 0.00–0.07)
Basophils Absolute: 0.1 10*3/uL (ref 0.0–0.1)
Basophils Relative: 0 %
Eosinophils Absolute: 0 10*3/uL (ref 0.0–0.5)
Eosinophils Relative: 0 %
HCT: 47.1 % (ref 39.0–52.0)
Hemoglobin: 16.2 g/dL (ref 13.0–17.0)
Immature Granulocytes: 1 %
Lymphocytes Relative: 4 %
Lymphs Abs: 0.6 10*3/uL — ABNORMAL LOW (ref 0.7–4.0)
MCH: 29.6 pg (ref 26.0–34.0)
MCHC: 34.4 g/dL (ref 30.0–36.0)
MCV: 86.1 fL (ref 80.0–100.0)
Monocytes Absolute: 0.8 10*3/uL (ref 0.1–1.0)
Monocytes Relative: 5 %
Neutro Abs: 15.1 10*3/uL — ABNORMAL HIGH (ref 1.7–7.7)
Neutrophils Relative %: 90 %
Platelets: 271 10*3/uL (ref 150–400)
RBC: 5.47 MIL/uL (ref 4.22–5.81)
RDW: 13 % (ref 11.5–15.5)
WBC: 16.7 10*3/uL — ABNORMAL HIGH (ref 4.0–10.5)
nRBC: 0 % (ref 0.0–0.2)

## 2020-12-20 LAB — LIPASE, BLOOD: Lipase: 57 U/L — ABNORMAL HIGH (ref 11–51)

## 2020-12-20 LAB — URINALYSIS, COMPLETE (UACMP) WITH MICROSCOPIC
Bilirubin Urine: NEGATIVE
Glucose, UA: NEGATIVE mg/dL
Hgb urine dipstick: NEGATIVE
Ketones, ur: 15 mg/dL — AB
Leukocytes,Ua: NEGATIVE
Nitrite: NEGATIVE
Protein, ur: NEGATIVE mg/dL
Specific Gravity, Urine: 1.015 (ref 1.005–1.030)
pH: >9 — ABNORMAL HIGH (ref 5.0–8.0)

## 2020-12-20 LAB — RAPID URINE DRUG SCREEN, HOSP PERFORMED
Amphetamines: NOT DETECTED
Barbiturates: NOT DETECTED
Benzodiazepines: NOT DETECTED
Cocaine: NOT DETECTED
Opiates: NOT DETECTED
Tetrahydrocannabinol: POSITIVE — AB

## 2020-12-20 LAB — RESP PANEL BY RT-PCR (FLU A&B, COVID) ARPGX2
Influenza A by PCR: NEGATIVE
Influenza B by PCR: NEGATIVE
SARS Coronavirus 2 by RT PCR: NEGATIVE

## 2020-12-20 LAB — SALICYLATE LEVEL: Salicylate Lvl: <7 mg/dL — ABNORMAL LOW (ref 7.0–30.0)

## 2020-12-20 LAB — ACETAMINOPHEN LEVEL: Acetaminophen (Tylenol), Serum: <10 ug/mL — ABNORMAL LOW (ref 10–30)

## 2020-12-20 LAB — ETHANOL: Alcohol, Ethyl (B): <10 mg/dL (ref <10)

## 2020-12-20 MED ORDER — ONDANSETRON 4 MG PO TBDP: 4.0000 mg | ORAL_TABLET | Freq: Three times a day (TID) | ORAL | 0 refills | Status: DC | PRN

## 2020-12-20 MED ORDER — KETOROLAC TROMETHAMINE 30 MG/ML IJ SOLN
30.0000 mg | Freq: Once | INTRAMUSCULAR | Status: AC
  Administered 2020-12-20: 30 mg via INTRAVENOUS
  Filled 2020-12-20: qty 1

## 2020-12-20 MED ORDER — PANTOPRAZOLE SODIUM 40 MG IV SOLR
40.0000 mg | Freq: Once | INTRAVENOUS | Status: AC
  Administered 2020-12-20: 40 mg via INTRAVENOUS
  Filled 2020-12-20: qty 40

## 2020-12-20 MED ORDER — ONDANSETRON HCL 4 MG/2ML IJ SOLN
4.0000 mg | Freq: Once | INTRAMUSCULAR | Status: AC
  Administered 2020-12-20: 4 mg via INTRAVENOUS
  Filled 2020-12-20: qty 2

## 2020-12-20 MED ORDER — PANTOPRAZOLE SODIUM 20 MG PO TBEC: 20.0000 mg | DELAYED_RELEASE_TABLET | Freq: Every day | ORAL | 0 refills | Status: DC

## 2020-12-20 MED ORDER — LACTATED RINGERS IV BOLUS
1000.0000 mL | Freq: Once | INTRAVENOUS | Status: AC
  Administered 2020-12-20: 1000 mL via INTRAVENOUS

## 2020-12-20 NOTE — ED Triage Notes (Signed)
  C/o abd pain and vomiting after drinking alcohol  and taking too much medication
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
## 2020-12-20 NOTE — Discharge Instructions (Addendum)
You were seen in the ER today for your nausea and vomiting.  Your physical exam, vital signs, and CT scan were very reassuring.  You tested negative for COVID-19 and influenza.  With the exact cause of your nausea and vomiting remains unclear, there does not appear to be any emergent problem going on.  Been prescribed a medication called Zofran to take as needed for your nausea.  Additionally you have been given a refill prescription of Protonix.  Please follow-up as scheduled with your gastroenterologist for next week.  Please increase your hydration but take care to avoid drinks that are red in color as they can be confusing and concerning if you vomit them up.  Return to the ER if you develop any worsening abdominal pain, nausea or vomiting does not stop, pain in your neck, blurry vision, chest pain, shortness of breath, or any other new severe symptoms.

## 2020-12-21 LAB — RPR: RPR Ser Ql: NONREACTIVE

## 2020-12-21 LAB — HIV ANTIBODY (ROUTINE TESTING W REFLEX): HIV Screen 4th Generation wRfx: NONREACTIVE

## 2020-12-23 LAB — GC/CHLAMYDIA PROBE AMP (~~LOC~~) NOT AT ARMC
Chlamydia: NEGATIVE
Comment: NEGATIVE
Comment: NORMAL
Neisseria Gonorrhea: NEGATIVE

## 2020-12-25 ENCOUNTER — Inpatient Hospital Stay: Payer: 59 | Admitting: Physician Assistant
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
## 2021-07-10 ENCOUNTER — Emergency Department (HOSPITAL_BASED_OUTPATIENT_CLINIC_OR_DEPARTMENT_OTHER)
Admission: EM | Admit: 2021-07-10 | Discharge: 2021-07-10 | Disposition: A | Discharge status: Home / Self Care | Payer: 59 | Attending: Emergency Medicine | Admitting: Emergency Medicine

## 2021-07-10 ENCOUNTER — Other Ambulatory Visit: Payer: Self-pay

## 2021-07-10 ENCOUNTER — Encounter (HOSPITAL_BASED_OUTPATIENT_CLINIC_OR_DEPARTMENT_OTHER): Payer: Self-pay | Admitting: Emergency Medicine

## 2021-07-10 DIAGNOSIS — R059 Cough, unspecified: Secondary | ICD-10-CM | POA: Diagnosis present

## 2021-07-10 DIAGNOSIS — U071 COVID-19: Secondary | ICD-10-CM | POA: Insufficient documentation

## 2021-07-10 LAB — RESP PANEL BY RT-PCR (FLU A&B, COVID) ARPGX2
Influenza A by PCR: NEGATIVE
Influenza B by PCR: NEGATIVE
SARS Coronavirus 2 by RT PCR: POSITIVE — AB

## 2021-07-10 MED ORDER — IBUPROFEN 800 MG PO TABS
800.0000 mg | ORAL_TABLET | Freq: Once | ORAL | Status: AC
  Administered 2021-07-10: 07:00:00 800 mg via ORAL
  Filled 2021-07-10: qty 1

## 2021-07-10 MED ORDER — ACETAMINOPHEN 325 MG PO TABS: 650.0000 mg | ORAL_TABLET | Freq: Four times a day (QID) | ORAL | 0 refills | Status: AC | PRN

## 2021-07-10 MED ORDER — IBUPROFEN 800 MG PO TABS: 800.0000 mg | ORAL_TABLET | Freq: Three times a day (TID) | ORAL | 0 refills | Status: AC | PRN

## 2021-07-10 NOTE — ED Triage Notes (Signed)
Pt c/o cough, sore throat, and headache x 3-4 days

## 2021-07-10 NOTE — ED Provider Notes (Signed)
[facility] EMERGENCY DEPARTMENT Provider Note   CSN: [id number] Arrival date & time: 07/10/21  0545     History Chief Complaint  Patient presents with   URI    Jason Cruz is a 25 y.o. male.  Patient is a 25 year old male with past medical history of prior GI bleed.  Patient presenting today for evaluation of URI symptoms.  He describes a 3-day history of cough, sore throat, congestion, and headache.  This has been worsening.  He denies any ill contacts.  He denies any chest pain or difficulty breathing.  The history is provided by the patient.  URI Presenting symptoms: congestion, cough, fatigue, fever and sore throat   Severity:  Moderate Onset quality:  Gradual Duration:  3 days Timing:  Constant Progression:  Worsening Chronicity:  New Relieved by:  Nothing     Past Medical History:  Diagnosis Date   ETOH abuse    Polysubstance abuse (HCC)     Patient Active Problem List   Diagnosis Date Noted   GI bleed 11/10/2020   Polysubstance abuse (HCC) 11/09/2020   Tibia/fibula fracture, left, closed, with routine healing, subsequent encounter 11/09/2020   Polysubstance (including opioids) dependence, daily use (HCC)    Gastritis and gastroduodenitis    Acute gastric ulcer with hemorrhage    Mallory-Weiss tear    Gastrointestinal hemorrhage 11/08/2020    Past Surgical History:  Procedure Laterality Date   ESOPHAGOGASTRODUODENOSCOPY (EGD) WITH PROPOFOL N/A 11/09/2020   Procedure: ESOPHAGOGASTRODUODENOSCOPY (EGD) WITH PROPOFOL;  Surgeon: Napoleon Form, MD;  Location: [facility];  Service: Endoscopy;  Laterality: N/A;   ORIF ANKLE FRACTURE Left 10/31/2020   Procedure: Open Reduction Internal Fixation (ORIF) Left trimalleolar fracture;  Surgeon: Toni Arthurs, MD;  Location: [facility];  Service: Orthopedics;  Laterality: Left;       History reviewed. No pertinent family history.  Social History   Tobacco Use   Smoking status: Never    Smokeless tobacco: Never  Substance Use Topics   Alcohol use: Yes    Alcohol/week: 12.0 standard drinks    Types: 12 Cans of beer per week    Comment: cutting back to a tall can every other day   Drug use: Yes    Types: Marijuana, Other-see comments    Comment: cocaine and hallucinagens     Home Medications Prior to Admission medications   Medication Sig Start Date End Date Taking? Authorizing Provider  acetaminophen (TYLENOL) 500 MG tablet Take 500 mg by mouth every 6 (six) hours as needed for mild pain, moderate pain or headache.    [provider]  docusate sodium (COLACE) 100 MG capsule Take 1 capsule (100 mg total) by mouth 2 (two) times daily. While taking narcotic pain medicine. 10/31/20   Jacinta Shoe, PA-C  folic acid (FOLVITE) 1 MG tablet Take 1 tablet (1 mg total) by mouth daily. 11/11/20   Joseph Art, DO  ondansetron (ZOFRAN ODT) 4 MG disintegrating tablet Take 1 tablet (4 mg total) by mouth every 8 (eight) hours as needed for nausea or vomiting. 12/20/20   Sponseller, Lupe Carney R, PA-C  pantoprazole (PROTONIX) 20 MG tablet Take 1 tablet (20 mg total) by mouth daily. 12/20/20 01/19/21  Sponseller, Lupe Carney R, PA-C  thiamine 100 MG tablet Take 1 tablet (100 mg total) by mouth daily. 11/11/20   Joseph Art, DO  traMADol (ULTRAM) 50 MG tablet Take 50-100 mg by mouth as needed for moderate pain or severe pain. 11/05/20  [provider]    Allergies    Patient has no known allergies.  Review of Systems   Review of Systems  Constitutional:  Positive for fatigue and fever.  HENT:  Positive for congestion and sore throat.   Respiratory:  Positive for cough.   All other systems reviewed and are negative.  Physical Exam Updated Vital Signs BP 125/82    Pulse 94    Temp 98.8 F (37.1 C) (Oral)    Resp 16    Ht 5\' 6"  (1.676 m)    Wt 83.9 kg    SpO2 96%    BMI 29.86 kg/m   Physical Exam Vitals and nursing note reviewed.  Constitutional:      General:  He is not in acute distress.    Appearance: He is well-developed. He is not diaphoretic.  HENT:     Head: Normocephalic and atraumatic.     Mouth/Throat:     Mouth: Mucous membranes are moist.     Pharynx: Posterior oropharyngeal erythema present. No oropharyngeal exudate.  Cardiovascular:     Rate and Rhythm: Normal rate and regular rhythm.     Heart sounds: No murmur heard.   No friction rub.  Pulmonary:     Effort: Pulmonary effort is normal. No respiratory distress.     Breath sounds: Normal breath sounds. No wheezing or rales.  Abdominal:     General: Bowel sounds are normal. There is no distension.     Palpations: Abdomen is soft.     Tenderness: There is no abdominal tenderness.  Musculoskeletal:        General: Normal range of motion.     Cervical back: Normal range of motion and neck supple.  Skin:    General: Skin is warm and dry.  Neurological:     Mental Status: He is alert and oriented to person, place, and time.     Coordination: Coordination normal.    ED Results / Procedures / Treatments   Labs (all labs ordered are listed, but only abnormal results are displayed) Labs Reviewed  RESP PANEL BY RT-PCR (FLU A&B, COVID) ARPGX2    EKG None  Radiology No results found.  Procedures Procedures   Medications Ordered in ED Medications - No data to display  ED Course  I have reviewed the triage vital signs and the nursing notes.  Pertinent labs & imaging results that were available during my care of the patient were reviewed by me and considered in my medical decision making (see chart for details).    MDM Rules/Calculators/A&P  Patient presenting with URI symptoms.  COVID/influenza swab is pending.  Disposition pending results.  Care signed out to oncoming provider who will obtain the results and determine proper treatment.  Final Clinical Impression(s) / ED Diagnoses Final diagnoses:  None    Rx / DC Orders ED Discharge Orders     None         , MD 07/11/21 0120

## 2021-07-11 ENCOUNTER — Other Ambulatory Visit: Payer: Self-pay

## 2021-07-11 ENCOUNTER — Emergency Department (HOSPITAL_BASED_OUTPATIENT_CLINIC_OR_DEPARTMENT_OTHER)
Admission: EM | Admit: 2021-07-11 | Discharge: 2021-07-11 | Disposition: A | Discharge status: Home / Self Care | Payer: 59 | Attending: Emergency Medicine | Admitting: Emergency Medicine

## 2021-07-11 DIAGNOSIS — U071 COVID-19: Secondary | ICD-10-CM | POA: Insufficient documentation

## 2021-07-11 DIAGNOSIS — J020 Streptococcal pharyngitis: Secondary | ICD-10-CM | POA: Insufficient documentation

## 2021-07-11 DIAGNOSIS — R059 Cough, unspecified: Secondary | ICD-10-CM | POA: Diagnosis present

## 2021-07-11 LAB — GROUP A STREP BY PCR: Group A Strep by PCR: DETECTED — AB

## 2021-07-11 MED ORDER — BENZONATATE 100 MG PO CAPS
200.0000 mg | ORAL_CAPSULE | Freq: Once | ORAL | Status: AC
  Administered 2021-07-11: 22:00:00 200 mg via ORAL
  Filled 2021-07-11: qty 2

## 2021-07-11 MED ORDER — AMOXICILLIN 500 MG PO CAPS: 1000.0000 mg | ORAL_CAPSULE | ORAL | 0 refills | Status: DC

## 2021-07-11 MED ORDER — ONDANSETRON HCL 4 MG PO TABS: 4.0000 mg | ORAL_TABLET | Freq: Four times a day (QID) | ORAL | 0 refills | Status: AC

## 2021-07-11 MED ORDER — AMOXICILLIN 500 MG PO CAPS
1000.0000 mg | ORAL_CAPSULE | Freq: Once | ORAL | Status: AC
  Administered 2021-07-11: 23:00:00 1000 mg via ORAL
  Filled 2021-07-11: qty 2

## 2021-07-11 MED ORDER — BENZONATATE 100 MG PO CAPS: 100.0000 mg | ORAL_CAPSULE | Freq: Once | ORAL | Status: DC

## 2021-07-11 MED ORDER — KETOROLAC TROMETHAMINE 60 MG/2ML IM SOLN
60.0000 mg | Freq: Once | INTRAMUSCULAR | Status: AC
  Administered 2021-07-11: 22:00:00 60 mg via INTRAMUSCULAR
  Filled 2021-07-11: qty 2

## 2021-07-11 MED ORDER — BENZONATATE 100 MG PO CAPS: 100.0000 mg | ORAL_CAPSULE | Freq: Three times a day (TID) | ORAL | 0 refills | Status: AC

## 2021-07-11 MED ORDER — PROMETHAZINE HCL 25 MG/ML IJ SOLN
25.0000 mg | Freq: Once | INTRAMUSCULAR | Status: AC
  Administered 2021-07-11: 23:00:00 25 mg via INTRAMUSCULAR
  Filled 2021-07-11: qty 1

## 2021-07-11 MED ORDER — ONDANSETRON 4 MG PO TBDP
4.0000 mg | ORAL_TABLET | Freq: Once | ORAL | Status: AC
  Administered 2021-07-11: 22:00:00 4 mg via ORAL
  Filled 2021-07-11: qty 1

## 2021-07-11 NOTE — ED Provider Notes (Signed)
[facility] EMERGENCY DEPARTMENT Provider Note   CSN: [id number] Arrival date & time: 07/11/21  2137     History Chief Complaint  Patient presents with   Sore Throat   Emesis    Jason Cruz is a 25 y.o. male with a past medical history of alcohol use disorder and polysubstance abuse presenting today with a complaint of emesis and sore throat for the past 1 to 2 days.  Patient was seen and diagnosed with COVID-19 in this department yesterday.  He reports that on Sunday he began to have a headache and the next day he started to have sore throat, congestion and a mild cough.  Today he is back with worsening sore throat and vomiting.  States he is having difficulty drinking and eating due to sore throat.  Has been taking 800 mg ibuprofen pills as needed.  Past Medical History:  Diagnosis Date   ETOH abuse    Polysubstance abuse [facility])     Patient Active Problem List   Diagnosis Date Noted   GI bleed 11/10/2020   Polysubstance abuse (HCC) 11/09/2020   Tibia/fibula fracture, left, closed, with routine healing, subsequent encounter 11/09/2020   Polysubstance (including opioids) dependence, daily use (HCC)    Gastritis and gastroduodenitis    Acute gastric ulcer with hemorrhage    Mallory-Weiss tear    Gastrointestinal hemorrhage 11/08/2020    Past Surgical History:  Procedure Laterality Date   ESOPHAGOGASTRODUODENOSCOPY (EGD) WITH PROPOFOL N/A 11/09/2020   Procedure: ESOPHAGOGASTRODUODENOSCOPY (EGD) WITH PROPOFOL;  Surgeon: Napoleon Form, MD;  Location: [facility];  Service: Endoscopy;  Laterality: N/A;   ORIF ANKLE FRACTURE Left 10/31/2020   Procedure: Open Reduction Internal Fixation (ORIF) Left trimalleolar fracture;  Surgeon: Toni Arthurs, MD;  Location: [facility];  Service: Orthopedics;  Laterality: Left;       No family history on file.  Social History   Tobacco Use   Smoking status: Never   Smokeless tobacco: Never  Substance Use Topics    Alcohol use: Yes    Alcohol/week: 12.0 standard drinks    Types: 12 Cans of beer per week    Comment: cutting back to a tall can every other day   Drug use: Yes    Types: Marijuana, Other-see comments    Comment: cocaine and hallucinagens     Home Medications Prior to Admission medications   Medication Sig Start Date End Date Taking? Authorizing Provider  acetaminophen (TYLENOL) 325 MG tablet Take 2 tablets (650 mg total) by mouth every 6 (six) hours as needed for up to 30 doses for fever, moderate pain, mild pain or headache. 07/10/21   Trifan, Kermit Balo, MD  acetaminophen (TYLENOL) 500 MG tablet Take 500 mg by mouth every 6 (six) hours as needed for mild pain, moderate pain or headache.    [provider]  docusate sodium (COLACE) 100 MG capsule Take 1 capsule (100 mg total) by mouth 2 (two) times daily. While taking narcotic pain medicine. 10/31/20   Jacinta Shoe, PA-C  folic acid (FOLVITE) 1 MG tablet Take 1 tablet (1 mg total) by mouth daily. 11/11/20   Joseph Art, DO  ibuprofen (ADVIL) 800 MG tablet Take 1 tablet (800 mg total) by mouth every 8 (eight) hours as needed for up to 21 doses. 07/10/21   Terald Sleeper, MD  ondansetron (ZOFRAN ODT) 4 MG disintegrating tablet Take 1 tablet (4 mg total) by mouth every 8 (eight) hours as needed for nausea or  vomiting. 12/20/20   Sponseller, Lupe Carney R, PA-C  pantoprazole (PROTONIX) 20 MG tablet Take 1 tablet (20 mg total) by mouth daily. 12/20/20 01/19/21  Sponseller, Lupe Carney R, PA-C  thiamine 100 MG tablet Take 1 tablet (100 mg total) by mouth daily. 11/11/20   Joseph Art, DO  traMADol (ULTRAM) 50 MG tablet Take 50-100 mg by mouth as needed for moderate pain or severe pain. 11/05/20   [provider]    Allergies    Patient has no known allergies.  Review of Systems   Review of Systems  Constitutional:  Positive for fatigue.  HENT:  Positive for sore throat.   Gastrointestinal:  Positive for abdominal pain,  diarrhea and vomiting.  Musculoskeletal:  Positive for myalgias.  Neurological:  Positive for headaches.   Physical Exam Updated Vital Signs BP (!) 151/97 (BP Location: Right Arm)    Pulse 66    Temp 97.9 F (36.6 C) (Oral)    Resp (!) 22    Ht 5\' 6"  (1.676 m)    Wt 83.9 kg    SpO2 100%    BMI 29.86 kg/m   Physical Exam Vitals and nursing note reviewed.  Constitutional:      Appearance: Normal appearance. He is ill-appearing.  HENT:     Head: Normocephalic and atraumatic.     Mouth/Throat:     Mouth: Mucous membranes are moist.     Pharynx: Oropharynx is clear. Posterior oropharyngeal erythema present. No pharyngeal swelling.     Tonsils: Tonsillar exudate present. No tonsillar abscesses. 3+ on the right. 3+ on the left.  Eyes:     General: No scleral icterus.    Conjunctiva/sclera: Conjunctivae normal.  Cardiovascular:     Rate and Rhythm: Normal rate and regular rhythm.  Pulmonary:     Effort: Pulmonary effort is normal. No respiratory distress.  Skin:    Findings: No rash.  Neurological:     Mental Status: He is alert.  Psychiatric:        Mood and Affect: Mood normal.    ED Results / Procedures / Treatments   Labs (all labs ordered are listed, but only abnormal results are displayed) Labs Reviewed - No data to display  EKG None  Radiology No results found.  Procedures Procedures   Medications Ordered in ED Medications - No data to display  ED Course  I have reviewed the triage vital signs and the nursing notes.  Pertinent labs & imaging results that were available during my care of the patient were reviewed by me and considered in my medical decision making (see chart for details).    MDM Rules/Calculators/A&P 25 year old male presenting with COVID symptoms.  Physical exam seems similar to yesterday however he does have tonsillar exudate and swelling.  Strep throat test is positive.  Patient has been given a shot of Toradol in the department today.  Also  given a dose of benzonatate and Zofran.  He has continued to vomit some in the department, but he appears to be more anxious than acutely ill.  Noted to be rolling around on stretcher and on the department floor.  Requesting something to sleep because he "has not had any alcohol and cannot even smoke weed because his throat hurts."  We discussed the treatment of NyQuil and Benadryl that may help with his symptoms as well as his sleep.  He will also be given IM Phenergan and his first dose of amoxicillin in the department at this time.  I have  sent Zofran and Tessalon Perles to the pharmacy to assist in his nausea and cough.  Remainder of antibiotics sent to the pharmacy as well.  Final Clinical Impression(s) / ED Diagnoses Final diagnoses:  COVID-19  Strep pharyngitis    Rx / DC Orders Results and diagnoses were explained to the patient. Return precautions discussed in full. Patient had no additional questions and expressed complete understanding.     Woodroe Chen 07/11/21 2305    Charlynne Pander, MD 07/13/21 [phone]

## 2021-07-11 NOTE — Discharge Instructions (Addendum)
I have sent the remainder of the antibiotic to the pharmacy.  You will take this nightly for the next 9 days because you got your first dose in the department today.  The shots you have been given today are for pain and nausea.  The medication for a sore throat and cough has also been sent to the pharmacy.  If you continue to have difficulty sleeping, I suggest NyQuil or Benadryl.  These will help with both your COVID and strep throat symptoms as well as sleep.

## 2021-07-11 NOTE — ED Triage Notes (Signed)
Pt states he was diagnosed with COVID 2 days ago here. Pt states his throat hurts worse and that he cant eat anything because he has been vomiting.

## 2021-07-11 NOTE — ED Notes (Signed)
Pt ambulatory from triage, no assistance needed.  

## 2021-07-12 ENCOUNTER — Emergency Department (HOSPITAL_BASED_OUTPATIENT_CLINIC_OR_DEPARTMENT_OTHER): Payer: 59

## 2021-07-12 ENCOUNTER — Emergency Department (HOSPITAL_BASED_OUTPATIENT_CLINIC_OR_DEPARTMENT_OTHER)
Admission: EM | Admit: 2021-07-12 | Discharge: 2021-07-12 | Discharge status: Against Medical Advice | Payer: 59 | Attending: Emergency Medicine | Admitting: Emergency Medicine

## 2021-07-12 ENCOUNTER — Encounter (HOSPITAL_BASED_OUTPATIENT_CLINIC_OR_DEPARTMENT_OTHER): Payer: Self-pay | Admitting: Emergency Medicine

## 2021-07-12 DIAGNOSIS — Z79899 Other long term (current) drug therapy: Secondary | ICD-10-CM | POA: Insufficient documentation

## 2021-07-12 DIAGNOSIS — J029 Acute pharyngitis, unspecified: Secondary | ICD-10-CM | POA: Diagnosis not present

## 2021-07-12 DIAGNOSIS — K920 Hematemesis: Secondary | ICD-10-CM

## 2021-07-12 DIAGNOSIS — J020 Streptococcal pharyngitis: Secondary | ICD-10-CM

## 2021-07-12 DIAGNOSIS — R109 Unspecified abdominal pain: Secondary | ICD-10-CM | POA: Diagnosis not present

## 2021-07-12 DIAGNOSIS — E860 Dehydration: Secondary | ICD-10-CM

## 2021-07-12 DIAGNOSIS — U071 COVID-19: Secondary | ICD-10-CM | POA: Diagnosis present

## 2021-07-12 DIAGNOSIS — R0902 Hypoxemia: Secondary | ICD-10-CM

## 2021-07-12 LAB — COMPREHENSIVE METABOLIC PANEL
ALT: 17 U/L (ref 0–44)
AST: 28 U/L (ref 15–41)
Albumin: 4.1 g/dL (ref 3.5–5.0)
Alkaline Phosphatase: 52 U/L (ref 38–126)
Anion gap: 14 (ref 5–15)
BUN: 11 mg/dL (ref 6–20)
CO2: 20 mmol/L — ABNORMAL LOW (ref 22–32)
Calcium: 8.9 mg/dL (ref 8.9–10.3)
Chloride: 103 mmol/L (ref 98–111)
Creatinine, Ser: 1.06 mg/dL (ref 0.61–1.24)
GFR, Estimated: >60 mL/min (ref >60)
Glucose, Bld: 125 mg/dL — ABNORMAL HIGH (ref 70–99)
Potassium: 2.9 mmol/L — ABNORMAL LOW (ref 3.5–5.1)
Sodium: 137 mmol/L (ref 135–145)
Total Bilirubin: 0.5 mg/dL (ref 0.3–1.2)
Total Protein: 8.3 g/dL — ABNORMAL HIGH (ref 6.5–8.1)

## 2021-07-12 LAB — CBC WITH DIFFERENTIAL/PLATELET
Abs Immature Granulocytes: 0.09 10*3/uL — ABNORMAL HIGH (ref 0.00–0.07)
Basophils Absolute: 0 10*3/uL (ref 0.0–0.1)
Basophils Relative: 0 %
Eosinophils Absolute: 0.5 10*3/uL (ref 0.0–0.5)
Eosinophils Relative: 4 %
HCT: 40.5 % (ref 39.0–52.0)
Hemoglobin: 13.7 g/dL (ref 13.0–17.0)
Immature Granulocytes: 1 %
Lymphocytes Relative: 7 %
Lymphs Abs: 0.9 10*3/uL (ref 0.7–4.0)
MCH: 29.4 pg (ref 26.0–34.0)
MCHC: 33.8 g/dL (ref 30.0–36.0)
MCV: 86.9 fL (ref 80.0–100.0)
Monocytes Absolute: 2 10*3/uL — ABNORMAL HIGH (ref 0.1–1.0)
Monocytes Relative: 14 %
Neutro Abs: 10.6 10*3/uL — ABNORMAL HIGH (ref 1.7–7.7)
Neutrophils Relative %: 74 %
Platelets: 244 10*3/uL (ref 150–400)
RBC: 4.66 MIL/uL (ref 4.22–5.81)
RDW: 13.6 % (ref 11.5–15.5)
Smear Review: NORMAL
WBC: 14.1 10*3/uL — ABNORMAL HIGH (ref 4.0–10.5)
nRBC: 0 % (ref 0.0–0.2)

## 2021-07-12 LAB — URINALYSIS, MICROSCOPIC (REFLEX)

## 2021-07-12 LAB — RAPID URINE DRUG SCREEN, HOSP PERFORMED
Amphetamines: NOT DETECTED
Barbiturates: NOT DETECTED
Benzodiazepines: NOT DETECTED
Cocaine: NOT DETECTED
Opiates: NOT DETECTED
Tetrahydrocannabinol: POSITIVE — AB

## 2021-07-12 LAB — URINALYSIS, ROUTINE W REFLEX MICROSCOPIC
Glucose, UA: NEGATIVE mg/dL
Ketones, ur: >=160 mg/dL — AB
Leukocytes,Ua: NEGATIVE
Nitrite: NEGATIVE
Protein, ur: 100 mg/dL — AB
Specific Gravity, Urine: 1.025 (ref 1.005–1.030)
pH: 6.5 (ref 5.0–8.0)

## 2021-07-12 LAB — LIPASE, BLOOD: Lipase: 24 U/L (ref 11–51)

## 2021-07-12 LAB — OCCULT BLOOD X 1 CARD TO LAB, STOOL: Fecal Occult Bld: NEGATIVE

## 2021-07-12 LAB — PROTIME-INR
INR: 1.1 (ref 0.8–1.2)
Prothrombin Time: 14.2 seconds (ref 11.4–15.2)

## 2021-07-12 LAB — GASTRIC OCCULT BLOOD (1-CARD TO LAB): Occult Blood, Gastric: POSITIVE — AB

## 2021-07-12 MED ORDER — PENICILLIN G BENZATHINE 1200000 UNIT/2ML IM SUSY
1.2000 10*6.[IU] | PREFILLED_SYRINGE | Freq: Once | INTRAMUSCULAR | Status: AC
  Administered 2021-07-12: 04:00:00 1.2 10*6.[IU] via INTRAMUSCULAR
  Filled 2021-07-12: qty 2

## 2021-07-12 MED ORDER — SODIUM CHLORIDE 0.9 % IV SOLN: INTRAVENOUS | Status: DC

## 2021-07-12 MED ORDER — LIDOCAINE VISCOUS HCL 2 % MT SOLN
15.0000 mL | Freq: Once | OROMUCOSAL | Status: AC
  Administered 2021-07-12: 06:00:00 15 mL via OROMUCOSAL
  Filled 2021-07-12: qty 15

## 2021-07-12 MED ORDER — SODIUM CHLORIDE 0.9 % IV BOLUS
1000.0000 mL | Freq: Once | INTRAVENOUS | Status: AC
  Administered 2021-07-12: 04:00:00 1000 mL via INTRAVENOUS

## 2021-07-12 MED ORDER — ONDANSETRON HCL 4 MG/2ML IJ SOLN
4.0000 mg | Freq: Once | INTRAMUSCULAR | Status: AC
  Administered 2021-07-12: 04:00:00 4 mg via INTRAVENOUS
  Filled 2021-07-12: qty 2

## 2021-07-12 MED ORDER — POTASSIUM CHLORIDE 10 MEQ/100ML IV SOLN
10.0000 meq | INTRAVENOUS | Status: AC
  Administered 2021-07-12 (×3): 10 meq via INTRAVENOUS
  Filled 2021-07-12 (×3): qty 100

## 2021-07-12 MED ORDER — SODIUM CHLORIDE 0.9 % IV BOLUS
1000.0000 mL | Freq: Once | INTRAVENOUS | Status: AC
  Administered 2021-07-12: 07:00:00 1000 mL via INTRAVENOUS

## 2021-07-12 MED ORDER — PROCHLORPERAZINE EDISYLATE 10 MG/2ML IJ SOLN
10.0000 mg | Freq: Once | INTRAMUSCULAR | Status: AC
  Administered 2021-07-12: 12:00:00 10 mg via INTRAVENOUS
  Filled 2021-07-12: qty 2

## 2021-07-12 MED ORDER — DEXAMETHASONE SODIUM PHOSPHATE 10 MG/ML IJ SOLN
10.0000 mg | Freq: Once | INTRAMUSCULAR | Status: AC
  Administered 2021-07-12: 05:00:00 10 mg via INTRAVENOUS
  Filled 2021-07-12: qty 1

## 2021-07-12 MED ORDER — DIPHENHYDRAMINE HCL 50 MG/ML IJ SOLN
12.5000 mg | Freq: Once | INTRAMUSCULAR | Status: AC
  Administered 2021-07-12: 12:00:00 12.5 mg via INTRAVENOUS
  Filled 2021-07-12: qty 1

## 2021-07-12 MED ORDER — KETOROLAC TROMETHAMINE 15 MG/ML IJ SOLN: 15.0000 mg | Freq: Once | INTRAMUSCULAR | Status: DC

## 2021-07-12 MED ORDER — PANTOPRAZOLE SODIUM 40 MG IV SOLR
40.0000 mg | Freq: Once | INTRAVENOUS | Status: AC
  Administered 2021-07-12: 04:00:00 40 mg via INTRAVENOUS
  Filled 2021-07-12: qty 40

## 2021-07-12 MED ORDER — ACETAMINOPHEN 325 MG PO TABS
650.0000 mg | ORAL_TABLET | Freq: Once | ORAL | Status: AC
  Administered 2021-07-12: 09:00:00 650 mg via ORAL
  Filled 2021-07-12: qty 2

## 2021-07-12 MED ORDER — LIDOCAINE VISCOUS HCL 2 % MT SOLN
15.0000 mL | Freq: Once | OROMUCOSAL | Status: AC
  Administered 2021-07-12: 09:00:00 15 mL via OROMUCOSAL
  Filled 2021-07-12: qty 15

## 2021-07-12 NOTE — Plan of Care (Addendum)
[facility] transfer note:  25 yo M with hematemesis and COVID.  Having hematemesis in ED.  H/o EtOH and polysubstance abuse.  Also has strep throat.  New O2 requirement from COVID.  EDP thinks GIB due to mallory weis tear.  Will put in for tele bed.  Per GI: hes considered unassigned, so call whoever is on for unassigned based on which ever hospital he gets bed at.  [facility] will assume care on arrival to accepting facility. Until arrival, care as per EDP. However, [facility] available 24/7 for questions and assistance.  Nursing staff, please page [facility] Admits and Consults [phone]) as soon as the patient arrives the hospital.

## 2021-07-12 NOTE — ED Provider Notes (Signed)
[facility] EMERGENCY DEPARTMENT Provider Note   CSN: [id number] Arrival date & time: 07/12/21  1191     History No chief complaint on file.   Jason Cruz is a 25 y.o. male.  Patient returns with nausea, vomiting, reported "vomiting blood".  Patient diagnosed with COVID 2 days ago with headache body ache, chills and sore throat.  Return to the ED last evening with sore throat and vomiting.  Found to be strep positive at that time and prescribed amoxicillin and Zofran and Tessalon.  He states he is unable to keep anything down and vomits every time he tries to drink something.  Thinks he is throwing up blood because he sees red streaks of brown streaks in his emesis.  Stool has been loose but not black or bloody.  History of ulcers in the past and he is concerned that they are bleeding again.  He continues to use ibuprofen at home and is no longer taking a PPI. States he has not had any alcohol or marijuana in more than a week. Had some abdominal pain earlier with vomiting but none currently.  Does feel achy and short of breath and having a severe sore throat.  Does not think he is had a fever. States he has been trying to induce vomiting by sticking his fingers down his throat due to having extreme nausea. Sometimes when he throws up the emesis is clear at other times or streaks of red and streaks of black.  The history is provided by the patient.      Past Medical History:  Diagnosis Date   ETOH abuse    Polysubstance abuse [facility])     Patient Active Problem List   Diagnosis Date Noted   GI bleed 11/10/2020   Polysubstance abuse (HCC) 11/09/2020   Tibia/fibula fracture, left, closed, with routine healing, subsequent encounter 11/09/2020   Polysubstance (including opioids) dependence, daily use (HCC)    Gastritis and gastroduodenitis    Acute gastric ulcer with hemorrhage    Mallory-Weiss tear    Gastrointestinal hemorrhage 11/08/2020    Past Surgical History:   Procedure Laterality Date   ESOPHAGOGASTRODUODENOSCOPY (EGD) WITH PROPOFOL N/A 11/09/2020   Procedure: ESOPHAGOGASTRODUODENOSCOPY (EGD) WITH PROPOFOL;  Surgeon: Napoleon Form, MD;  Location: [facility];  Service: Endoscopy;  Laterality: N/A;   ORIF ANKLE FRACTURE Left 10/31/2020   Procedure: Open Reduction Internal Fixation (ORIF) Left trimalleolar fracture;  Surgeon: Toni Arthurs, MD;  Location: [facility];  Service: Orthopedics;  Laterality: Left;       History reviewed. No pertinent family history.  Social History   Tobacco Use   Smoking status: Never   Smokeless tobacco: Never  Substance Use Topics   Alcohol use: Yes    Alcohol/week: 12.0 standard drinks    Types: 12 Cans of beer per week    Comment: cutting back to a tall can every other day   Drug use: Yes    Types: Marijuana, Other-see comments    Comment: cocaine and hallucinagens     Home Medications Prior to Admission medications   Medication Sig Start Date End Date Taking? Authorizing Provider  acetaminophen (TYLENOL) 325 MG tablet Take 2 tablets (650 mg total) by mouth every 6 (six) hours as needed for up to 30 doses for fever, moderate pain, mild pain or headache. 07/10/21   Trifan, Kermit Balo, MD  acetaminophen (TYLENOL) 500 MG tablet Take 500 mg by mouth every 6 (six) hours as needed for mild pain, moderate  pain or headache.    [provider]  amoxicillin (AMOXIL) 500 MG capsule Take 2 capsules (1,000 mg total) by mouth 1 day or 1 dose for 9 doses. 07/11/21 07/20/21  Redwine, Madison A, PA-C  benzonatate (TESSALON) 100 MG capsule Take 1 capsule (100 mg total) by mouth every 8 (eight) hours. 07/11/21   Redwine, Madison A, PA-C  docusate sodium (COLACE) 100 MG capsule Take 1 capsule (100 mg total) by mouth 2 (two) times daily. While taking narcotic pain medicine. 10/31/20   Jacinta Shoe, PA-C  folic acid (FOLVITE) 1 MG tablet Take 1 tablet (1 mg total) by mouth daily. 11/11/20   Joseph Art, DO  ibuprofen (ADVIL) 800 MG tablet Take 1 tablet (800 mg total) by mouth every 8 (eight) hours as needed for up to 21 doses. 07/10/21   Terald Sleeper, MD  ondansetron (ZOFRAN ODT) 4 MG disintegrating tablet Take 1 tablet (4 mg total) by mouth every 8 (eight) hours as needed for nausea or vomiting. 12/20/20   Sponseller, Lupe Carney R, PA-C  ondansetron (ZOFRAN) 4 MG tablet Take 1 tablet (4 mg total) by mouth every 6 (six) hours for 7 days. 07/11/21 07/18/21  Redwine, Madison A, PA-C  pantoprazole (PROTONIX) 20 MG tablet Take 1 tablet (20 mg total) by mouth daily. 12/20/20 01/19/21  Sponseller, Lupe Carney R, PA-C  thiamine 100 MG tablet Take 1 tablet (100 mg total) by mouth daily. 11/11/20   Joseph Art, DO  traMADol (ULTRAM) 50 MG tablet Take 50-100 mg by mouth as needed for moderate pain or severe pain. 11/05/20   [provider]    Allergies    Patient has no known allergies.  Review of Systems   Review of Systems  Constitutional:  Positive for activity change, appetite change, chills and fatigue. Negative for fever.  HENT:  Positive for congestion, sore throat and trouble swallowing. Negative for facial swelling.   Respiratory:  Negative for cough, chest tightness and shortness of breath.   Cardiovascular:  Negative for chest pain.  Gastrointestinal:  Positive for abdominal pain, nausea and vomiting. Negative for blood in stool.  Genitourinary:  Negative for dysuria.  Musculoskeletal:  Positive for arthralgias and myalgias.  Neurological:  Positive for weakness. Negative for headaches.   all other systems are negative except as noted in the HPI and PMH.   Physical Exam Updated Vital Signs BP (!) 156/95    Pulse 67    Temp 97.9 F (36.6 C) (Oral)    Resp 20    Ht 5\' 6"  (1.676 m)    Wt 83.9 kg    SpO2 98%    BMI 29.85 kg/m   Physical Exam Vitals and nursing note reviewed.  Constitutional:      General: He is not in acute distress.    Appearance: He is well-developed.   HENT:     Head: Normocephalic and atraumatic.     Mouth/Throat:     Pharynx: Oropharyngeal exudate and posterior oropharyngeal erythema present.     Comments: Enlarged tonsils with exudates bilaterally.  Uvula is midline. No bulging of soft palate Eyes:     Conjunctiva/sclera: Conjunctivae normal.     Pupils: Pupils are equal, round, and reactive to light.  Neck:     Comments: No meningismus. Cardiovascular:     Rate and Rhythm: Normal rate and regular rhythm.     Heart sounds: Normal heart sounds. No murmur heard. Pulmonary:     Effort: Pulmonary effort is normal.  No respiratory distress.     Breath sounds: Normal breath sounds.  Abdominal:     Palpations: Abdomen is soft.     Tenderness: There is no abdominal tenderness. There is no guarding or rebound.  Genitourinary:    Comments: No hemorrhoids or fissures.  No gross blood Musculoskeletal:        General: No tenderness. Normal range of motion.     Cervical back: Normal range of motion and neck supple.  Skin:    General: Skin is warm.  Neurological:     Mental Status: He is alert and oriented to person, place, and time.     Cranial Nerves: No cranial nerve deficit.     Motor: No abnormal muscle tone.     Coordination: Coordination normal.     Comments:  5/5 strength throughout. CN 2-12 intact.Equal grip strength.   Psychiatric:        Behavior: Behavior normal.    ED Results / Procedures / Treatments   Labs (all labs ordered are listed, but only abnormal results are displayed) Labs Reviewed  COMPREHENSIVE METABOLIC PANEL - Abnormal; Notable for the following components:      Result Value   Potassium 2.9 (*)    CO2 20 (*)    Glucose, Bld 125 (*)    Total Protein 8.3 (*)    All other components within normal limits  CBC WITH DIFFERENTIAL/PLATELET - Abnormal; Notable for the following components:   WBC 14.1 (*)    Neutro Abs 10.6 (*)    Monocytes Absolute 2.0 (*)    Abs Immature Granulocytes 0.09 (*)    All  other components within normal limits  GASTRIC OCCULT BLOOD (1-CARD TO LAB) - Abnormal; Notable for the following components:   Occult Blood, Gastric POSITIVE (*)    All other components within normal limits  OCCULT BLOOD X 1 CARD TO LAB, STOOL  PROTIME-INR  LIPASE, BLOOD  URINALYSIS, ROUTINE W REFLEX MICROSCOPIC  RAPID URINE DRUG SCREEN, HOSP PERFORMED  GASTRIC OCCULT BLOOD (1-CARD TO LAB)    EKG None  Radiology DG Neck Soft Tissue  Result Date: 07/12/2021 CLINICAL DATA:  COVID+, strep, vomiting EXAM: NECK SOFT TISSUES - 1+ VIEW COMPARISON:  None. FINDINGS: There is no evidence of retropharyngeal soft tissue swelling or epiglottic enlargement. The cervical airway is unremarkable and no radio-opaque foreign body identified. IMPRESSION: Negative. Electronically Signed   By: Charlett Nose M.D.   On: 07/12/2021 05:50   DG Abdomen Acute W/Chest  Result Date: 07/12/2021 CLINICAL DATA:  vomtiing, hypoxia, COVID+ EXAM: DG ABDOMEN ACUTE WITH 1 VIEW CHEST COMPARISON:  CT 12/20/2020 FINDINGS: There is no evidence of dilated bowel loops or free intraperitoneal air. No radiopaque calculi or other significant radiographic abnormality is seen. Heart size and mediastinal contours are within normal limits. Both lungs are clear. IMPRESSION: Negative abdominal radiographs.  No acute cardiopulmonary disease. Electronically Signed   By: Charlett Nose M.D.   On: 07/12/2021 05:49    Procedures Procedures   Medications Ordered in ED Medications  sodium chloride 0.9 % bolus 1,000 mL (has no administration in time range)    And  0.9 %  sodium chloride infusion (has no administration in time range)  pantoprazole (PROTONIX) injection 40 mg (has no administration in time range)  ondansetron (ZOFRAN) injection 4 mg (has no administration in time range)  penicillin g benzathine (BICILLIN LA) 1200000 UNIT/2ML injection 1.2 Million Units (has no administration in time range)    ED Course  I have  reviewed the  triage vital signs and the nursing notes.  Pertinent labs & imaging results that were available during my care of the patient were reviewed by me and considered in my medical decision making (see chart for details).    MDM Rules/Calculators/A&P                         COVID-positive patient also with strep here with intractable nausea and vomiting concern for GI bleed.  Vitals are stable.  No distress.  Abdomen soft without peritoneal signs.  No evidence blood on rectal exam.  Abdomen is soft.  Labs will be obtained and patient will be hydrated.  He had an EGD in April 2022 that showed hiatal hernia, Mallory-Weiss tear, nonbleeding gastric ulcers, duodenitis and gastritis.  IM bicillin will be given due to his inability to tolerate PO amoxicillin.  Hemoglobin stable at 13.7.  Hemoccult is negative.  Patient with some desaturation to the mid 80s when sleeping.  No diagnosis of sleep apnea.  Suspect this may be due to his enlarged tonsils.  Given his hypoxia, COVID +, and degree of tonsillar swelling we will give dose of steroids despite risk of potentiating GI bleeding.  Gastroccult is positive.  Suspect likely Mallory-Weiss tear more so than bleeding ulcer.  Hemoccult is negative  Vitals remained stable.  No witnessed hematemesis in the ED.  Plan admission for further hydration, symptom control, GI evaluation for recurrent hematemesis. With his hypoxia and enlarged tonsils Decadron is cautiously initiated as benefits to his breathing outweigh risks of worsening history of bleed at this time. Soft tissue neck xray without RP edema. Normal epiglottis.   D/w Dr. Julian Reil. Message sent to  GI.     Final Clinical Impression(s) / ED Diagnoses Final diagnoses:  None    Rx / DC Orders ED Discharge Orders     None        Natiya Seelinger, Jeannett Senior, MD 07/12/21 [phone]

## 2021-07-12 NOTE — ED Notes (Signed)
Patient placed on 2L [place] due to desaturation. Patient oxygen saturation increased to 98%. Patient tolerating well.

## 2021-07-12 NOTE — ED Triage Notes (Signed)
Pt states unable to eat, keep down fluids and is vomiting blood. Feels like needs fluids and protonix.

## 2021-07-12 NOTE — ED Notes (Signed)
Pt O2 saturations dropping to 80% while asleep. RT notified and pt placed on 2L O2 via [place]

## 2021-07-12 NOTE — ED Provider Notes (Signed)
Patient eloped from the ED.   Virgina Norfolk, DO 07/12/21 1155

## 2021-07-12 NOTE — ED Notes (Signed)
Pt. Just walked out AMA.  He was not willing to stop so that we can give him is discharge papers.  He IV is laying on the table.

## 2021-07-13 ENCOUNTER — Encounter (HOSPITAL_BASED_OUTPATIENT_CLINIC_OR_DEPARTMENT_OTHER): Payer: Self-pay

## 2021-07-13 ENCOUNTER — Emergency Department (HOSPITAL_BASED_OUTPATIENT_CLINIC_OR_DEPARTMENT_OTHER)
Admission: EM | Admit: 2021-07-13 | Discharge: 2021-07-13 | Disposition: A | Discharge status: Home / Self Care | Payer: 59 | Attending: Emergency Medicine | Admitting: Emergency Medicine

## 2021-07-13 ENCOUNTER — Other Ambulatory Visit: Payer: Self-pay

## 2021-07-13 DIAGNOSIS — R509 Fever, unspecified: Secondary | ICD-10-CM | POA: Diagnosis present

## 2021-07-13 DIAGNOSIS — U071 COVID-19: Secondary | ICD-10-CM | POA: Diagnosis not present

## 2021-07-13 DIAGNOSIS — J020 Streptococcal pharyngitis: Secondary | ICD-10-CM | POA: Insufficient documentation

## 2021-07-13 LAB — CBC WITH DIFFERENTIAL/PLATELET
Abs Immature Granulocytes: 0.09 10*3/uL — ABNORMAL HIGH (ref 0.00–0.07)
Basophils Absolute: 0.1 10*3/uL (ref 0.0–0.1)
Basophils Relative: 1 %
Eosinophils Absolute: 0 10*3/uL (ref 0.0–0.5)
Eosinophils Relative: 0 %
HCT: 42 % (ref 39.0–52.0)
Hemoglobin: 14.2 g/dL (ref 13.0–17.0)
Immature Granulocytes: 1 %
Lymphocytes Relative: 22 %
Lymphs Abs: 2.3 10*3/uL (ref 0.7–4.0)
MCH: 29.4 pg (ref 26.0–34.0)
MCHC: 33.8 g/dL (ref 30.0–36.0)
MCV: 87 fL (ref 80.0–100.0)
Monocytes Absolute: 1.4 10*3/uL — ABNORMAL HIGH (ref 0.1–1.0)
Monocytes Relative: 13 %
Neutro Abs: 6.6 10*3/uL (ref 1.7–7.7)
Neutrophils Relative %: 63 %
Platelets: 284 10*3/uL (ref 150–400)
RBC: 4.83 MIL/uL (ref 4.22–5.81)
RDW: 13.8 % (ref 11.5–15.5)
WBC: 10.9 10*3/uL — ABNORMAL HIGH (ref 4.0–10.5)
nRBC: 0 % (ref 0.0–0.2)

## 2021-07-13 LAB — OCCULT BLOOD X 1 CARD TO LAB, STOOL: Fecal Occult Bld: NEGATIVE

## 2021-07-13 LAB — HEPATIC FUNCTION PANEL
ALT: 26 U/L (ref 0–44)
AST: 31 U/L (ref 15–41)
Albumin: 3.7 g/dL (ref 3.5–5.0)
Alkaline Phosphatase: 48 U/L (ref 38–126)
Bilirubin, Direct: <0.1 mg/dL (ref 0.0–0.2)
Total Bilirubin: 0.2 mg/dL — ABNORMAL LOW (ref 0.3–1.2)
Total Protein: 7.7 g/dL (ref 6.5–8.1)

## 2021-07-13 LAB — BASIC METABOLIC PANEL
Anion gap: 10 (ref 5–15)
BUN: 13 mg/dL (ref 6–20)
CO2: 24 mmol/L (ref 22–32)
Calcium: 8.8 mg/dL — ABNORMAL LOW (ref 8.9–10.3)
Chloride: 104 mmol/L (ref 98–111)
Creatinine, Ser: 0.86 mg/dL (ref 0.61–1.24)
GFR, Estimated: >60 mL/min (ref >60)
Glucose, Bld: 102 mg/dL — ABNORMAL HIGH (ref 70–99)
Potassium: 3.2 mmol/L — ABNORMAL LOW (ref 3.5–5.1)
Sodium: 138 mmol/L (ref 135–145)

## 2021-07-13 MED ORDER — SODIUM CHLORIDE 0.9 % IV BOLUS
1000.0000 mL | Freq: Once | INTRAVENOUS | Status: AC
  Administered 2021-07-13: 19:00:00 1000 mL via INTRAVENOUS

## 2021-07-13 MED ORDER — LIDOCAINE VISCOUS HCL 2 % MT SOLN
15.0000 mL | Freq: Once | OROMUCOSAL | Status: AC
  Administered 2021-07-13: 19:00:00 15 mL via ORAL
  Filled 2021-07-13: qty 15

## 2021-07-13 MED ORDER — ONDANSETRON HCL 4 MG/2ML IJ SOLN
4.0000 mg | Freq: Once | INTRAMUSCULAR | Status: AC
  Administered 2021-07-13: 19:00:00 4 mg via INTRAVENOUS
  Filled 2021-07-13: qty 2

## 2021-07-13 MED ORDER — PANTOPRAZOLE SODIUM 40 MG IV SOLR
40.0000 mg | Freq: Once | INTRAVENOUS | Status: AC
  Administered 2021-07-13: 19:00:00 40 mg via INTRAVENOUS
  Filled 2021-07-13: qty 40

## 2021-07-13 MED ORDER — KETOROLAC TROMETHAMINE 15 MG/ML IJ SOLN
15.0000 mg | Freq: Once | INTRAMUSCULAR | Status: AC
  Administered 2021-07-13: 19:00:00 15 mg via INTRAVENOUS
  Filled 2021-07-13: qty 1

## 2021-07-13 MED ORDER — ALUM & MAG HYDROXIDE-SIMETH 200-200-20 MG/5ML PO SUSP
30.0000 mL | Freq: Once | ORAL | Status: AC
  Administered 2021-07-13: 30 mL via ORAL
  Filled 2021-07-13: qty 30

## 2021-07-13 MED ORDER — SODIUM CHLORIDE 0.9 % IV BOLUS
1000.0000 mL | Freq: Once | INTRAVENOUS | Status: AC
  Administered 2021-07-13: 21:00:00 1000 mL via INTRAVENOUS

## 2021-07-13 MED ORDER — ONDANSETRON HCL 4 MG/2ML IJ SOLN
4.0000 mg | Freq: Once | INTRAMUSCULAR | Status: AC
  Administered 2021-07-13: 21:00:00 4 mg via INTRAVENOUS
  Filled 2021-07-13: qty 2

## 2021-07-13 MED ORDER — ACETAMINOPHEN 160 MG/5ML PO SOLN
650.0000 mg | Freq: Once | ORAL | Status: AC
  Administered 2021-07-13: 19:00:00 650 mg via ORAL
  Filled 2021-07-13: qty 20.3

## 2021-07-13 MED ORDER — KETOROLAC TROMETHAMINE 15 MG/ML IJ SOLN
15.0000 mg | Freq: Once | INTRAMUSCULAR | Status: AC
  Administered 2021-07-13: 21:00:00 15 mg via INTRAVENOUS
  Filled 2021-07-13: qty 1

## 2021-07-13 NOTE — ED Notes (Signed)
ED Provider at bedside. 

## 2021-07-13 NOTE — ED Triage Notes (Addendum)
Pt reports recent +covid and +strep-states he feels worse-NAD-steady gait

## 2021-07-13 NOTE — Discharge Instructions (Addendum)
I have attached information about strep throat to his discharge papers.  I have also put in a referral to ear nose and throat which may be able to help you with ongoing infections or problems with your tonsils.   Also I am attaching an office for primary care to these papers if you need one.

## 2021-07-13 NOTE — ED Notes (Signed)
States he feels worse, decreased amt of fluid intake, stated also has not smoked marijuana or had alcohol for the past week. States he had a PCN injection yesterday. Visual exam of mouth indicates a swollen uvula, with white patches.

## 2021-07-13 NOTE — ED Provider Notes (Signed)
[facility] EMERGENCY DEPARTMENT Provider Note   CSN: [id number] Arrival date & time: 07/13/21  1823     History Chief Complaint  Patient presents with   Covid Positive    Jason Cruz is a 25 y.o. male with a past medical history of PUD, Mallory-Weiss tear and substance use disorder presenting today after being seen multiple times over the last few days with worsening symptoms of strep throat and COVID.  Patient had a positive occult stool test yesterday and was to be admitted for a potential GI bleed secondary to multiple episodes of emesis after being diagnosed with COVID and strep throat.  Patient eloped from the emergency department because "I am scared of hospitals."  He is back today at the request of his mother because he is not getting better.  No longer vomiting however has difficulty eating or drinking due to the pain in his throat.  Feels as though he is dehydrated and continues to have fevers.  He was given a penicillin shot for his strep throat yesterday.  Feels as though his COVID symptoms have resolved.   Past Medical History:  Diagnosis Date   ETOH abuse    Polysubstance abuse [facility])     Patient Active Problem List   Diagnosis Date Noted   Hematemesis 07/12/2021   GI bleed 11/10/2020   Polysubstance abuse (HCC) 11/09/2020   Tibia/fibula fracture, left, closed, with routine healing, subsequent encounter 11/09/2020   Polysubstance (including opioids) dependence, daily use (HCC)    Gastritis and gastroduodenitis    Acute gastric ulcer with hemorrhage    Mallory-Weiss tear    Gastrointestinal hemorrhage 11/08/2020    Past Surgical History:  Procedure Laterality Date   ESOPHAGOGASTRODUODENOSCOPY (EGD) WITH PROPOFOL N/A 11/09/2020   Procedure: ESOPHAGOGASTRODUODENOSCOPY (EGD) WITH PROPOFOL;  Surgeon: Napoleon Form, MD;  Location: [facility];  Service: Endoscopy;  Laterality: N/A;   ORIF ANKLE FRACTURE Left 10/31/2020   Procedure: Open Reduction Internal  Fixation (ORIF) Left trimalleolar fracture;  Surgeon: Toni Arthurs, MD;  Location: [facility];  Service: Orthopedics;  Laterality: Left;       No family history on file.  Social History   Tobacco Use   Smoking status: Never   Smokeless tobacco: Never  Vaping Use   Vaping Use: Never used  Substance Use Topics   Alcohol use: Not Currently   Drug use: Yes    Types: Marijuana    Home Medications Prior to Admission medications   Medication Sig Start Date End Date Taking? Authorizing Provider  acetaminophen (TYLENOL) 325 MG tablet Take 2 tablets (650 mg total) by mouth every 6 (six) hours as needed for up to 30 doses for fever, moderate pain, mild pain or headache. 07/10/21   Trifan, Kermit Balo, MD  acetaminophen (TYLENOL) 500 MG tablet Take 500 mg by mouth every 6 (six) hours as needed for mild pain, moderate pain or headache.    [provider]  amoxicillin (AMOXIL) 500 MG capsule Take 2 capsules (1,000 mg total) by mouth 1 day or 1 dose for 9 doses. 07/11/21 07/20/21  Tarrence Enck A, PA-C  benzonatate (TESSALON) 100 MG capsule Take 1 capsule (100 mg total) by mouth every 8 (eight) hours. 07/11/21   Heliodoro Domagalski A, PA-C  docusate sodium (COLACE) 100 MG capsule Take 1 capsule (100 mg total) by mouth 2 (two) times daily. While taking narcotic pain medicine. 10/31/20   Jacinta Shoe, PA-C  folic acid (FOLVITE) 1 MG tablet Take 1 tablet (  1 mg total) by mouth daily. 11/11/20   Joseph Art, DO  ibuprofen (ADVIL) 800 MG tablet Take 1 tablet (800 mg total) by mouth every 8 (eight) hours as needed for up to 21 doses. 07/10/21   Terald Sleeper, MD  ondansetron (ZOFRAN ODT) 4 MG disintegrating tablet Take 1 tablet (4 mg total) by mouth every 8 (eight) hours as needed for nausea or vomiting. 12/20/20   Sponseller, Lupe Carney R, PA-C  ondansetron (ZOFRAN) 4 MG tablet Take 1 tablet (4 mg total) by mouth every 6 (six) hours for 7 days. 07/11/21 07/18/21  Genavieve Mangiapane  A, PA-C  pantoprazole (PROTONIX) 20 MG tablet Take 1 tablet (20 mg total) by mouth daily. 12/20/20 01/19/21  Sponseller, Lupe Carney R, PA-C  thiamine 100 MG tablet Take 1 tablet (100 mg total) by mouth daily. 11/11/20   Joseph Art, DO  traMADol (ULTRAM) 50 MG tablet Take 50-100 mg by mouth as needed for moderate pain or severe pain. 11/05/20   [provider]    Allergies    Patient has no known allergies.  Review of Systems   Review of Systems  Constitutional:  Positive for chills and fever.  HENT:  Positive for sore throat and trouble swallowing.   Respiratory:  Negative for shortness of breath.   Gastrointestinal:  Positive for nausea. Negative for blood in stool, constipation, diarrhea, rectal pain and vomiting.  Musculoskeletal:  Positive for myalgias.   Physical Exam Updated Vital Signs BP (!) 141/86    Pulse 89    Temp (!) 102.4 F (39.1 C) (Oral)    Resp 18    Ht 5\' 6"  (1.676 m)    Wt 78.5 kg    SpO2 98%    BMI 27.92 kg/m   Physical Exam Vitals and nursing note reviewed.  Constitutional:      Appearance: Normal appearance. He is ill-appearing.  HENT:     Head: Normocephalic and atraumatic.     Right Ear: Tympanic membrane normal.     Left Ear: Tympanic membrane normal.     Mouth/Throat:     Mouth: Mucous membranes are moist.     Pharynx: Oropharyngeal exudate and posterior oropharyngeal erythema present.     Comments: 4+ right and 3+ left tonsillar swelling with exudate Eyes:     General: No scleral icterus.    Conjunctiva/sclera: Conjunctivae normal.  Cardiovascular:     Rate and Rhythm: Normal rate and regular rhythm.  Pulmonary:     Effort: Pulmonary effort is normal. No respiratory distress.     Breath sounds: No wheezing.  Musculoskeletal:     Cervical back: Normal range of motion.  Skin:    General: Skin is warm and dry.     Findings: No rash.  Neurological:     Mental Status: He is alert.  Psychiatric:        Mood and Affect: Mood normal.     ED Results / Procedures / Treatments   Labs (all labs ordered are listed, but only abnormal results are displayed) Labs Reviewed  CBC WITH DIFFERENTIAL/PLATELET - Abnormal; Notable for the following components:      Result Value   WBC 10.9 (*)    Monocytes Absolute 1.4 (*)    Abs Immature Granulocytes 0.09 (*)    All other components within normal limits  BASIC METABOLIC PANEL - Abnormal; Notable for the following components:   Potassium 3.2 (*)    Glucose, Bld 102 (*)    Calcium 8.8 (*)  All other components within normal limits  HEPATIC FUNCTION PANEL - Abnormal; Notable for the following components:   Total Bilirubin 0.2 (*)    All other components within normal limits  OCCULT BLOOD X 1 CARD TO LAB, STOOL    EKG None  Radiology DG Neck Soft Tissue  Result Date: 07/12/2021 CLINICAL DATA:  COVID+, strep, vomiting EXAM: NECK SOFT TISSUES - 1+ VIEW COMPARISON:  None. FINDINGS: There is no evidence of retropharyngeal soft tissue swelling or epiglottic enlargement. The cervical airway is unremarkable and no radio-opaque foreign body identified. IMPRESSION: Negative. Electronically Signed   By: Charlett Nose M.D.   On: 07/12/2021 05:50   DG Abdomen Acute W/Chest  Result Date: 07/12/2021 CLINICAL DATA:  vomtiing, hypoxia, COVID+ EXAM: DG ABDOMEN ACUTE WITH 1 VIEW CHEST COMPARISON:  CT 12/20/2020 FINDINGS: There is no evidence of dilated bowel loops or free intraperitoneal air. No radiopaque calculi or other significant radiographic abnormality is seen. Heart size and mediastinal contours are within normal limits. Both lungs are clear. IMPRESSION: Negative abdominal radiographs.  No acute cardiopulmonary disease. Electronically Signed   By: Charlett Nose M.D.   On: 07/12/2021 05:49    Procedures Procedures   Medications Ordered in ED Medications  sodium chloride 0.9 % bolus 1,000 mL (0 mLs Intravenous Stopped 07/13/21 2000)  acetaminophen (TYLENOL) 160 MG/5ML solution 650 mg  (650 mg Oral Given 07/13/21 1900)  ketorolac (TORADOL) 15 MG/ML injection 15 mg (15 mg Intravenous Given 07/13/21 1857)  ondansetron (ZOFRAN) injection 4 mg (4 mg Intravenous Given 07/13/21 1857)  pantoprazole (PROTONIX) injection 40 mg (40 mg Intravenous Given 07/13/21 1924)  alum & mag hydroxide-simeth (MAALOX/MYLANTA) 200-200-20 MG/5ML suspension 30 mL (30 mLs Oral Given 07/13/21 1922)    And  lidocaine (XYLOCAINE) 2 % viscous mouth solution 15 mL (15 mLs Oral Given 07/13/21 1922)  ketorolac (TORADOL) 15 MG/ML injection 15 mg (15 mg Intravenous Given 07/13/21 2122)  sodium chloride 0.9 % bolus 1,000 mL (0 mLs Intravenous Stopped 07/13/21 2222)  ondansetron (ZOFRAN) injection 4 mg (4 mg Intravenous Given 07/13/21 2122)    ED Course  I have reviewed the triage vital signs and the nursing notes.  Pertinent labs & imaging results that were available during my care of the patient were reviewed by me and considered in my medical decision making (see chart for details).    MDM Rules/Calculators/A&P 25 year old male presenting to the emergency department with a complaint of sore throat after being diagnosed with strep throat on the 23rd.  I personally saw the patient at that time and sent him home with nausea medications as well as amoxicillin for sore throat.  He came back to the emergency department with continued emesis that he reported had blood in it.  Hemoccult was positive and admission was accepted however patient eloped prior to transfer.  He feels as though his sore throat is not getting better.  Main concern is dehydration.  He has been given 2 fluid boluses, Zofran, Toradol and Protonix.  He reports feeling better after this.  Also given a Magic mouthwash which relieved his discomfort.  My occult stool test was negative today, no indications for admission at this time.  Patient requesting food and drink and tolerated p.o. intake.  Fever was treated and patient is stable for discharge  home at this time.  Return precautions attached to discharge papers.  I have also given him a referral to ENT if he continues to have problems with his tonsils long-term.  No more  antibiotics indicated at this time due to recent penicillin injection.  Agreeable and stable for discharge.  Final Clinical Impression(s) / ED Diagnoses Final diagnoses:  Strep throat    Rx / DC Orders Results and diagnoses were explained to the patient. Return precautions discussed in full. Patient had no additional questions and expressed complete understanding.     Woodroe Chen 07/13/21 2354    Alvira Monday, MD 07/16/21 1659

## 2021-07-14 ENCOUNTER — Encounter (HOSPITAL_BASED_OUTPATIENT_CLINIC_OR_DEPARTMENT_OTHER): Payer: Self-pay | Admitting: Urology

## 2021-07-14 ENCOUNTER — Emergency Department (HOSPITAL_BASED_OUTPATIENT_CLINIC_OR_DEPARTMENT_OTHER)
Admission: EM | Admit: 2021-07-14 | Discharge: 2021-07-14 | Disposition: A | Discharge status: Home / Self Care | Payer: 59 | Attending: Emergency Medicine | Admitting: Emergency Medicine

## 2021-07-14 DIAGNOSIS — J039 Acute tonsillitis, unspecified: Secondary | ICD-10-CM | POA: Insufficient documentation

## 2021-07-14 DIAGNOSIS — J029 Acute pharyngitis, unspecified: Secondary | ICD-10-CM | POA: Diagnosis present

## 2021-07-14 LAB — CBC WITH DIFFERENTIAL/PLATELET
Abs Immature Granulocytes: 0.22 10*3/uL — ABNORMAL HIGH (ref 0.00–0.07)
Basophils Absolute: 0.1 10*3/uL (ref 0.0–0.1)
Basophils Relative: 1 %
Eosinophils Absolute: 0 10*3/uL (ref 0.0–0.5)
Eosinophils Relative: 0 %
HCT: 40.2 % (ref 39.0–52.0)
Hemoglobin: 13.7 g/dL (ref 13.0–17.0)
Immature Granulocytes: 3 %
Lymphocytes Relative: 22 %
Lymphs Abs: 1.9 10*3/uL (ref 0.7–4.0)
MCH: 29.2 pg (ref 26.0–34.0)
MCHC: 34.1 g/dL (ref 30.0–36.0)
MCV: 85.7 fL (ref 80.0–100.0)
Monocytes Absolute: 1.6 10*3/uL — ABNORMAL HIGH (ref 0.1–1.0)
Monocytes Relative: 19 %
Neutro Abs: 4.9 10*3/uL (ref 1.7–7.7)
Neutrophils Relative %: 55 %
Platelets: 266 10*3/uL (ref 150–400)
RBC: 4.69 MIL/uL (ref 4.22–5.81)
RDW: 13.5 % (ref 11.5–15.5)
WBC: 8.7 10*3/uL (ref 4.0–10.5)
nRBC: 0 % (ref 0.0–0.2)

## 2021-07-14 LAB — MAGNESIUM: Magnesium: 2 mg/dL (ref 1.7–2.4)

## 2021-07-14 LAB — COMPREHENSIVE METABOLIC PANEL
ALT: 19 U/L (ref 0–44)
AST: 21 U/L (ref 15–41)
Albumin: 3.5 g/dL (ref 3.5–5.0)
Alkaline Phosphatase: 44 U/L (ref 38–126)
Anion gap: 10 (ref 5–15)
BUN: 9 mg/dL (ref 6–20)
CO2: 25 mmol/L (ref 22–32)
Calcium: 8.5 mg/dL — ABNORMAL LOW (ref 8.9–10.3)
Chloride: 100 mmol/L (ref 98–111)
Creatinine, Ser: 0.76 mg/dL (ref 0.61–1.24)
GFR, Estimated: >60 mL/min (ref >60)
Glucose, Bld: 99 mg/dL (ref 70–99)
Potassium: 3 mmol/L — ABNORMAL LOW (ref 3.5–5.1)
Sodium: 135 mmol/L (ref 135–145)
Total Bilirubin: 0.4 mg/dL (ref 0.3–1.2)
Total Protein: 7.2 g/dL (ref 6.5–8.1)

## 2021-07-14 MED ORDER — METHYLPREDNISOLONE SODIUM SUCC 125 MG IJ SOLR
125.0000 mg | Freq: Once | INTRAMUSCULAR | Status: AC
  Administered 2021-07-14: 19:00:00 125 mg via INTRAVENOUS
  Filled 2021-07-14: qty 2

## 2021-07-14 MED ORDER — POTASSIUM CHLORIDE 20 MEQ PO PACK
40.0000 meq | PACK | Freq: Once | ORAL | Status: AC
Start: 2021-07-14 — End: 2021-07-14
  Administered 2021-07-14: 20:00:00 40 meq via ORAL
  Filled 2021-07-14: qty 2

## 2021-07-14 MED ORDER — LACTATED RINGERS IV BOLUS
2000.0000 mL | Freq: Once | INTRAVENOUS | Status: AC
  Administered 2021-07-14: 19:00:00 1000 mL via INTRAVENOUS

## 2021-07-14 MED ORDER — CLINDAMYCIN HCL 150 MG PO CAPS: 300.0000 mg | ORAL_CAPSULE | Freq: Three times a day (TID) | ORAL | 0 refills | Status: AC

## 2021-07-14 MED ORDER — ONDANSETRON HCL 4 MG/2ML IJ SOLN
4.0000 mg | Freq: Once | INTRAMUSCULAR | Status: AC
  Administered 2021-07-14: 19:00:00 4 mg via INTRAVENOUS
  Filled 2021-07-14: qty 2

## 2021-07-14 MED ORDER — LACTATED RINGERS IV BOLUS: 1000.0000 mL | Freq: Once | INTRAVENOUS | Status: DC

## 2021-07-14 MED ORDER — DEXAMETHASONE 4 MG PO TABS
10.0000 mg | ORAL_TABLET | Freq: Once | ORAL | Status: AC
  Administered 2021-07-14: 21:00:00 10 mg via ORAL
  Filled 2021-07-14: qty 3

## 2021-07-14 MED ORDER — ONDANSETRON 4 MG PO TBDP: 4.0000 mg | ORAL_TABLET | Freq: Three times a day (TID) | ORAL | 0 refills | Status: DC | PRN

## 2021-07-14 MED ORDER — KETOROLAC TROMETHAMINE 15 MG/ML IJ SOLN
15.0000 mg | Freq: Once | INTRAMUSCULAR | Status: AC
  Administered 2021-07-14: 19:00:00 15 mg via INTRAVENOUS
  Filled 2021-07-14: qty 1

## 2021-07-14 NOTE — ED Provider Notes (Signed)
[facility] Provider Note   CSN: [id number] Arrival date & time: 07/14/21  1516     History Chief Complaint  Patient presents with   Sore Throat    Jason Cruz is a 25 y.o. male.   Sore Throat Pertinent negatives include no chest pain, no abdominal pain and no shortness of breath. Patient presents for worsening sore throat.  He is currently on day 7 of URI symptoms.  Symptoms have included cough, congestion, sore throat.  He has been seen in the ED every day for the past 4 days.  On his initial visit, he was diagnosed with COVID-19.  The following day, he developed vomiting.  He tested positive for strep throat and he was prescribed amoxicillin at that time.  The day after that, he presented with concern of hematemesis.  He was given IM Bicillin and Decadron.  He was seen yesterday for concern of fever and dehydration.  At that time, he was provided with symptomatic relief.  Today, he reports worsening sore throat, sensation of throat swelling, as well as nausea, vomiting, and p.o. intolerance.  Due to his p.o. intolerance, he has not taken any medications for relief of his throat pain today.     Past Medical History:  Diagnosis Date   ETOH abuse    Polysubstance abuse [facility])     Patient Active Problem List   Diagnosis Date Noted   Hematemesis 07/12/2021   GI bleed 11/10/2020   Polysubstance abuse ([place]) 11/09/2020   Tibia/fibula fracture, left, closed, with routine healing, subsequent encounter 11/09/2020   Polysubstance (including opioids) dependence, daily use (HCC)    Gastritis and gastroduodenitis    Acute gastric ulcer with hemorrhage    Mallory-Weiss tear    Gastrointestinal hemorrhage 11/08/2020    Past Surgical History:  Procedure Laterality Date   ESOPHAGOGASTRODUODENOSCOPY (EGD) WITH PROPOFOL N/A 11/09/2020   Procedure: ESOPHAGOGASTRODUODENOSCOPY (EGD) WITH PROPOFOL;  Surgeon: Mauri Pole, MD;  Location: [facility];  Service:  Endoscopy;  Laterality: N/A;   ORIF ANKLE FRACTURE Left 10/31/2020   Procedure: Open Reduction Internal Fixation (ORIF) Left trimalleolar fracture;  Surgeon: Wylene Simmer, MD;  Location: [place];  Service: Orthopedics;  Laterality: Left;       History reviewed. No pertinent family history.  Social History   Tobacco Use   Smoking status: Never   Smokeless tobacco: Never  Vaping Use   Vaping Use: Never used  Substance Use Topics   Alcohol use: Not Currently   Drug use: Yes    Types: Marijuana    Home Medications Prior to Admission medications   Medication Sig Start Date End Date Taking? Authorizing Provider  clindamycin (CLEOCIN) 150 MG capsule Take 2 capsules (300 mg total) by mouth 3 (three) times daily for 5 days. 07/14/21 07/19/21 Yes Godfrey Pick, MD  acetaminophen (TYLENOL) 325 MG tablet Take 2 tablets (650 mg total) by mouth every 6 (six) hours as needed for up to 30 doses for fever, moderate pain, mild pain or headache. 07/10/21   Trifan, Carola Rhine, MD  acetaminophen (TYLENOL) 500 MG tablet Take 500 mg by mouth every 6 (six) hours as needed for mild pain, moderate pain or headache.    [provider]  benzonatate (TESSALON) 100 MG capsule Take 1 capsule (100 mg total) by mouth every 8 (eight) hours. 07/11/21   Redwine, Madison A, PA-C  docusate sodium (COLACE) 100 MG capsule Take 1 capsule (100 mg total) by mouth 2 (two) times daily.  While taking narcotic pain medicine. 10/31/20   Corky Sing, PA-C  folic acid (FOLVITE) 1 MG tablet Take 1 tablet (1 mg total) by mouth daily. 11/11/20   Geradine Girt, DO  ibuprofen (ADVIL) 800 MG tablet Take 1 tablet (800 mg total) by mouth every 8 (eight) hours as needed for up to 21 doses. 07/10/21   Wyvonnia Dusky, MD  ondansetron (ZOFRAN ODT) 4 MG disintegrating tablet Take 1 tablet (4 mg total) by mouth every 8 (eight) hours as needed for nausea or vomiting. 07/14/21   Godfrey Pick, MD  ondansetron (ZOFRAN)  4 MG tablet Take 1 tablet (4 mg total) by mouth every 6 (six) hours for 7 days. 07/11/21 07/18/21  Redwine, Madison A, PA-C  pantoprazole (PROTONIX) 20 MG tablet Take 1 tablet (20 mg total) by mouth daily. 12/20/20 01/19/21  Sponseller, Eugene Garnet R, PA-C  thiamine 100 MG tablet Take 1 tablet (100 mg total) by mouth daily. 11/11/20   Geradine Girt, DO  traMADol (ULTRAM) 50 MG tablet Take 50-100 mg by mouth as needed for moderate pain or severe pain. 11/05/20   [provider]    Allergies    Patient has no known allergies.  Review of Systems   Review of Systems  Constitutional:  Positive for chills, fatigue and fever.  HENT:  Positive for sore throat and trouble swallowing. Negative for ear pain and rhinorrhea.   Eyes:  Negative for pain and visual disturbance.  Respiratory:  Negative for cough and shortness of breath.   Cardiovascular:  Negative for chest pain and palpitations.  Gastrointestinal:  Positive for nausea and vomiting. Negative for abdominal pain and diarrhea.  Genitourinary:  Negative for dysuria, flank pain and hematuria.  Musculoskeletal:  Negative for arthralgias and back pain.  Skin:  Negative for color change and rash.  Neurological:  Negative for dizziness, seizures, syncope and light-headedness.  All other systems reviewed and are negative.  Physical Exam Updated Vital Signs BP (!) 143/95    Pulse 77    Temp 98.5 F (36.9 C) (Oral)    Resp 18    Ht 5\' 6"  (1.676 m)    Wt 78.5 kg    SpO2 100%    BMI 27.92 kg/m   Physical Exam Vitals and nursing note reviewed.  Constitutional:      General: He is not in acute distress.    Appearance: He is well-developed and normal weight. He is not ill-appearing, toxic-appearing or diaphoretic.  HENT:     Head: Normocephalic and atraumatic.     Mouth/Throat:     Pharynx: Uvula midline. Oropharyngeal exudate and posterior oropharyngeal erythema present.     Tonsils: Tonsillar exudate present. 3+ on the right. 3+ on the left.   Eyes:     Conjunctiva/sclera: Conjunctivae normal.  Cardiovascular:     Rate and Rhythm: Normal rate and regular rhythm.     Heart sounds: No murmur heard. Pulmonary:     Effort: Pulmonary effort is normal. No respiratory distress.     Breath sounds: Normal breath sounds. No stridor. No wheezing or rales.  Abdominal:     Palpations: Abdomen is soft.     Tenderness: There is no abdominal tenderness.  Musculoskeletal:        General: No swelling.     Cervical back: Neck supple.  Skin:    General: Skin is warm and dry.     Capillary Refill: Capillary refill takes less than 2 seconds.     Coloration:  Skin is not pale.  Neurological:     General: No focal deficit present.     Mental Status: He is alert and oriented to person, place, and time.  Psychiatric:        Mood and Affect: Mood normal.        Behavior: Behavior normal.    ED Results / Procedures / Treatments   Labs (all labs ordered are listed, but only abnormal results are displayed) Labs Reviewed  CBC WITH DIFFERENTIAL/PLATELET - Abnormal; Notable for the following components:      Result Value   Monocytes Absolute 1.6 (*)    Abs Immature Granulocytes 0.22 (*)    All other components within normal limits  COMPREHENSIVE METABOLIC PANEL - Abnormal; Notable for the following components:   Potassium 3.0 (*)    Calcium 8.5 (*)    All other components within normal limits  MAGNESIUM    EKG None  Radiology No results found.  Procedures Procedures   Medications Ordered in ED Medications  ketorolac (TORADOL) 15 MG/ML injection 15 mg (15 mg Intravenous Given 07/14/21 1905)  methylPREDNISolone sodium succinate (SOLU-MEDROL) 125 mg/2 mL injection 125 mg (125 mg Intravenous Given 07/14/21 1906)  ondansetron (ZOFRAN) injection 4 mg (4 mg Intravenous Given 07/14/21 1902)  lactated ringers bolus 2,000 mL ( Intravenous Stopped 07/14/21 2105)  potassium chloride (KLOR-CON) packet 40 mEq (40 mEq Oral Given 07/14/21 1953)   dexamethasone (DECADRON) tablet 10 mg (10 mg Oral Given 07/14/21 2112)    ED Course  I have reviewed the triage vital signs and the nursing notes.  Pertinent labs & imaging results that were available during my care of the patient were reviewed by me and considered in my medical decision making (see chart for details).    MDM Rules/Calculators/A&P                         25 year old male presenting for persistent symptoms of sore throat, nausea, vomiting, and p.o. intolerance.  He has not taken any analgesia or antipyresis at home today.  He does arrive in the ED afebrile with normal vital signs.  He is well-appearing on exam.  He has 3+ bilateral tonsillar swelling with exudates present.  Swelling is symmetrical and uvula is midline.  Neck range of motion is intact.  3 days ago, he was given a dose of intramuscular Bicillin for treatment of strep pharyngitis.  At that time, he was also given Decadron.  It does not appear that he has had any further dosing of steroids.  Today, IV fluids, Toradol, Solu-Medrol, and Zofran were ordered.  Potassium was found to be low and replacement was ordered in the ED.  On reassessment, patient has improved symptoms and is eating and drinking.  Given his persistent symptoms, patient to undergo additional course of antibiotics.  Clindamycin was prescribed.  He was also advised to call ENT first thing in the morning to schedule a close follow-up.  This was reiterated on his AVS and highlighted.  Patient was given dose of Decadron prior to discharge for ongoing anti-inflammatory effects.  He is discharged in stable condition.  Final Clinical Impression(s) / ED Diagnoses Final diagnoses:  Tonsillitis    Rx / DC Orders ED Discharge Orders          Ordered    ondansetron (ZOFRAN ODT) 4 MG disintegrating tablet  Every 8 hours PRN        07/14/21 2107    clindamycin (CLEOCIN) 150  MG capsule  3 times daily        07/14/21 2107             Gloris Manchester,  MD 07/15/21 [phone]

## 2021-07-14 NOTE — ED Notes (Signed)
Pt tolerating water and crackers. Not tolerating potassium solution weel. Dr. Durwin Nora notified. Pt requesting ENT follow-up. Pt reports "if not I'll be back tomorrow."

## 2021-07-14 NOTE — ED Triage Notes (Signed)
Pt seen every day x past 4 days for same symptoms, + COVID, + Strep  Got antibiotic injection Thursday.  Denies N/V States throat worse Tonsils swollen and ulcerated, touching,

## 2021-07-14 NOTE — ED Notes (Signed)
ED Provider at bedside. Will update vitals once done.

## 2021-07-30 ENCOUNTER — Other Ambulatory Visit: Payer: Self-pay

## 2021-07-30 ENCOUNTER — Encounter (HOSPITAL_BASED_OUTPATIENT_CLINIC_OR_DEPARTMENT_OTHER): Payer: Self-pay

## 2021-07-30 ENCOUNTER — Emergency Department (HOSPITAL_BASED_OUTPATIENT_CLINIC_OR_DEPARTMENT_OTHER)
Admission: EM | Admit: 2021-07-30 | Discharge: 2021-07-30 | Disposition: A | Discharge status: Home / Self Care | Payer: Self-pay | Attending: Emergency Medicine | Admitting: Emergency Medicine

## 2021-07-30 DIAGNOSIS — R1013 Epigastric pain: Secondary | ICD-10-CM | POA: Insufficient documentation

## 2021-07-30 DIAGNOSIS — K2900 Acute gastritis without bleeding: Secondary | ICD-10-CM

## 2021-07-30 LAB — CBC WITH DIFFERENTIAL/PLATELET
Abs Immature Granulocytes: 0.07 10*3/uL (ref 0.00–0.07)
Basophils Absolute: 0.1 10*3/uL (ref 0.0–0.1)
Basophils Relative: 1 %
Eosinophils Absolute: 0 10*3/uL (ref 0.0–0.5)
Eosinophils Relative: 0 %
HCT: 41.5 % (ref 39.0–52.0)
Hemoglobin: 13.7 g/dL (ref 13.0–17.0)
Immature Granulocytes: 1 %
Lymphocytes Relative: 24 %
Lymphs Abs: 2.3 10*3/uL (ref 0.7–4.0)
MCH: 29.1 pg (ref 26.0–34.0)
MCHC: 33 g/dL (ref 30.0–36.0)
MCV: 88.3 fL (ref 80.0–100.0)
Monocytes Absolute: 0.8 10*3/uL (ref 0.1–1.0)
Monocytes Relative: 9 %
Neutro Abs: 6.4 10*3/uL (ref 1.7–7.7)
Neutrophils Relative %: 65 %
Platelets: 364 10*3/uL (ref 150–400)
RBC: 4.7 MIL/uL (ref 4.22–5.81)
RDW: 13.9 % (ref 11.5–15.5)
WBC: 9.7 10*3/uL (ref 4.0–10.5)
nRBC: 0 % (ref 0.0–0.2)

## 2021-07-30 LAB — COMPREHENSIVE METABOLIC PANEL
ALT: 42 U/L (ref 0–44)
AST: 22 U/L (ref 15–41)
Albumin: 3.8 g/dL (ref 3.5–5.0)
Alkaline Phosphatase: 43 U/L (ref 38–126)
Anion gap: 9 (ref 5–15)
BUN: 9 mg/dL (ref 6–20)
CO2: 25 mmol/L (ref 22–32)
Calcium: 9 mg/dL (ref 8.9–10.3)
Chloride: 102 mmol/L (ref 98–111)
Creatinine, Ser: 0.83 mg/dL (ref 0.61–1.24)
GFR, Estimated: >60 mL/min (ref >60)
Glucose, Bld: 108 mg/dL — ABNORMAL HIGH (ref 70–99)
Potassium: 3.8 mmol/L (ref 3.5–5.1)
Sodium: 136 mmol/L (ref 135–145)
Total Bilirubin: 0.5 mg/dL (ref 0.3–1.2)
Total Protein: 7.1 g/dL (ref 6.5–8.1)

## 2021-07-30 LAB — URINALYSIS, ROUTINE W REFLEX MICROSCOPIC
Bilirubin Urine: NEGATIVE
Glucose, UA: NEGATIVE mg/dL
Hgb urine dipstick: NEGATIVE
Ketones, ur: NEGATIVE mg/dL
Leukocytes,Ua: NEGATIVE
Nitrite: NEGATIVE
Protein, ur: NEGATIVE mg/dL
Specific Gravity, Urine: 1.02 (ref 1.005–1.030)
pH: 7.5 (ref 5.0–8.0)

## 2021-07-30 LAB — LIPASE, BLOOD: Lipase: 37 U/L (ref 11–51)

## 2021-07-30 MED ORDER — FAMOTIDINE IN NACL 20-0.9 MG/50ML-% IV SOLN
20.0000 mg | Freq: Once | INTRAVENOUS | Status: AC
  Administered 2021-07-30: 20 mg via INTRAVENOUS
  Filled 2021-07-30: qty 50

## 2021-07-30 MED ORDER — PANTOPRAZOLE SODIUM 40 MG IV SOLR
INTRAVENOUS | Status: AC
  Filled 2021-07-30: qty 80

## 2021-07-30 MED ORDER — PANTOPRAZOLE SODIUM 40 MG PO TBEC: 40.0000 mg | DELAYED_RELEASE_TABLET | Freq: Every day | ORAL | 0 refills | Status: DC

## 2021-07-30 MED ORDER — SODIUM CHLORIDE 0.9 % IV BOLUS
1000.0000 mL | Freq: Once | INTRAVENOUS | Status: AC
Start: 2021-07-30 — End: 2021-07-30
  Administered 2021-07-30: 1000 mL via INTRAVENOUS

## 2021-07-30 MED ORDER — ONDANSETRON HCL 4 MG/2ML IJ SOLN: 4.0000 mg | Freq: Once | INTRAMUSCULAR | Status: DC

## 2021-07-30 MED ORDER — SUCRALFATE 1 G PO TABS: 1.0000 g | ORAL_TABLET | Freq: Three times a day (TID) | ORAL | 0 refills | Status: DC

## 2021-07-30 MED ORDER — PANTOPRAZOLE 80MG IVPB - SIMPLE MED
80.0000 mg | Freq: Once | INTRAVENOUS | Status: AC
Start: 2021-07-30 — End: 2021-07-30
  Administered 2021-07-30: 80 mg via INTRAVENOUS
  Filled 2021-07-30: qty 100

## 2021-07-30 NOTE — Discharge Instructions (Addendum)
You likely have gastritis or gastric ulcer   Please avoid taking NSAIDs such as Motrin or ibuprofen  Take Protonix 40 mg daily  Take Carafate to help you with pain from the stomach ulcer  You need to call GI for follow-up.  You may need another endoscopy if you have persistent pain.  Return to ER if you have worse epigastric pain, vomiting

## 2021-07-30 NOTE — ED Provider Notes (Signed)
[facility] Provider Note   CSN: [id number] Arrival date & time: 07/30/21  2012     History  Chief Complaint  Patient presents with   Abdominal Pain    Jason Cruz is a 26 y.o. 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  Patient states that about a week ago, he was diagnosed with bronchitis.  He finished a course of steroids and also has been taking some ibuprofen.  He states that he has been having worsening abdominal cramps and epigastric pain.  Patient states that he has not been taking his Protonix and he is concerned that he may have recurrent stomach ulcer.  Patient denies any fevers.  The history is provided by the patient.      Home Medications Prior to Admission medications   Medication Sig Start Date End Date Taking? Authorizing Provider  acetaminophen (TYLENOL) 325 MG tablet Take 2 tablets (650 mg total) by mouth every 6 (six) hours as needed for up to 30 doses for fever, moderate pain, mild pain or headache. 07/10/21   Trifan, Carola Rhine, MD  acetaminophen (TYLENOL) 500 MG tablet Take 500 mg by mouth every 6 (six) hours as needed for mild pain, moderate pain or headache.    [provider]  benzonatate (TESSALON) 100 MG capsule Take 1 capsule (100 mg total) by mouth every 8 (eight) hours. 07/11/21   Redwine, Madison A, PA-C  docusate sodium (COLACE) 100 MG capsule Take 1 capsule (100 mg total) by mouth 2 (two) times daily. While taking narcotic pain medicine. 10/31/20   Corky Sing, PA-C  folic acid (FOLVITE) 1 MG tablet Take 1 tablet (1 mg total) by mouth daily. 11/11/20   Geradine Girt, DO  ibuprofen (ADVIL) 800 MG tablet Take 1 tablet (800 mg total) by mouth every 8 (eight) hours as needed for up to 21 doses. 07/10/21   Wyvonnia Dusky, MD  ondansetron (ZOFRAN ODT) 4 MG disintegrating tablet Take 1 tablet (4 mg total) by mouth every 8  (eight) hours as needed for nausea or vomiting. 07/14/21   Godfrey Pick, MD  pantoprazole (PROTONIX) 20 MG tablet Take 1 tablet (20 mg total) by mouth daily. 12/20/20 01/19/21  Sponseller, Eugene Garnet R, PA-C  thiamine 100 MG tablet Take 1 tablet (100 mg total) by mouth daily. 11/11/20   Geradine Girt, DO  traMADol (ULTRAM) 50 MG tablet Take 50-100 mg by mouth as needed for moderate pain or severe pain. 11/05/20   [provider]      Allergies    Patient has no known allergies.    Review of Systems   Review of Systems  Gastrointestinal:  Positive for abdominal pain.  All other systems reviewed and are negative.  Physical Exam Updated Vital Signs BP (!) 126/93 (BP Location: Left Arm)    Pulse (!) 112    Temp 98.6 F (37 C) (Oral)    Resp 18    Ht 5\' 6"  (1.676 m)    Wt 74.8 kg    SpO2 100%    BMI 26.63 kg/m  Physical Exam Vitals and nursing note reviewed.  Constitutional:      Comments: Slightly dehydrated  HENT:     Head: Normocephalic.     Mouth/Throat:     Mouth: Mucous membranes are moist.  Eyes:     Extraocular Movements: Extraocular movements intact.     Pupils: Pupils are  equal, round, and reactive to light.  Cardiovascular:     Rate and Rhythm: Normal rate and regular rhythm.  Abdominal:     General: Abdomen is flat.     Comments: Minimal epigastric tenderness.  No right upper quadrant tenderness  Skin:    General: Skin is warm.     Capillary Refill: Capillary refill takes less than 2 seconds.  Neurological:     General: No focal deficit present.     Mental Status: He is alert and oriented to person, place, and time.  Psychiatric:        Mood and Affect: Mood normal.        Behavior: Behavior normal.    ED Results / Procedures / Treatments   Labs (all labs ordered are listed, but only abnormal results are displayed) Labs Reviewed  COMPREHENSIVE METABOLIC PANEL - Abnormal; Notable for the following components:      Result Value   Glucose, Bld 108 (*)    All  other components within normal limits  CBC WITH DIFFERENTIAL/PLATELET  LIPASE, BLOOD  URINALYSIS, ROUTINE W REFLEX MICROSCOPIC    EKG None  Radiology No results found.  Procedures Procedures    Medications Ordered in ED Medications  ondansetron (ZOFRAN) injection 4 mg (4 mg Intravenous Patient Refused/Not Given 07/30/21 2150)  famotidine (PEPCID) IVPB 20 mg premix (20 mg Intravenous New Bag/Given 07/30/21 2234)  pantoprazole (PROTONIX) 80 mg /NS 100 mL IVPB (has no administration in time range)  pantoprazole (PROTONIX) 40 MG injection (has no administration in time range)  sodium chloride 0.9 % bolus 1,000 mL (1,000 mLs Intravenous New Bag/Given 07/30/21 2148)    ED Course/ Medical Decision Making/ A&P                           Medical Decision Making Jason Cruz is a 26 y.o. male here presenting with epigastric pain.  Patient recently took steroids and has not been compliant with his Protonix.  Patient has history of gastric ulcer.  Concern for possible recurrent ulcer versus gastritis.  I have low suspicion for biliary colic or acute cholecystitis.  Plan to get CBC CMP and lipase.  We will give GI cocktail and Pepcid and Protonix for gastritis.  11:14 PM Patient's heart rate is down to 77 after IVF. Labs reviewed. CBC and CMP and lipase unremarkable.  Felt better after GI cocktail.  Will refer patient to GI outpatient.  Will start patient on Protonix again and put patient on Carafate.  Gave strict return precautions  Amount and/or Complexity of Data Reviewed External Data Reviewed: labs. Labs: ordered. Decision-making details documented in ED Course. Radiology: ordered and independent interpretation performed.  Final Clinical Impression(s) / ED Diagnoses Final diagnoses:  None    Rx / DC Orders ED Discharge Orders     None         Drenda Freeze, MD 07/30/21 2323

## 2021-07-30 NOTE — ED Triage Notes (Signed)
Pt c/o abd cramps, n/s-states sx started after taking ibuprofen-NAD-steady gait

## 2021-07-31 ENCOUNTER — Other Ambulatory Visit: Payer: Self-pay

## 2021-07-31 ENCOUNTER — Emergency Department (HOSPITAL_BASED_OUTPATIENT_CLINIC_OR_DEPARTMENT_OTHER)
Admission: EM | Admit: 2021-07-31 | Discharge: 2021-07-31 | Disposition: A | Discharge status: Home / Self Care | Payer: Self-pay | Attending: Emergency Medicine | Admitting: Emergency Medicine

## 2021-07-31 ENCOUNTER — Emergency Department (HOSPITAL_COMMUNITY): Admission: EM | Admit: 2021-07-31 | Discharge: 2021-07-31 | Discharge status: Against Medical Advice | Payer: Self-pay

## 2021-07-31 ENCOUNTER — Encounter (HOSPITAL_BASED_OUTPATIENT_CLINIC_OR_DEPARTMENT_OTHER): Payer: Self-pay

## 2021-07-31 ENCOUNTER — Emergency Department (HOSPITAL_BASED_OUTPATIENT_CLINIC_OR_DEPARTMENT_OTHER): Payer: Self-pay

## 2021-07-31 DIAGNOSIS — R112 Nausea with vomiting, unspecified: Secondary | ICD-10-CM | POA: Insufficient documentation

## 2021-07-31 DIAGNOSIS — K449 Diaphragmatic hernia without obstruction or gangrene: Secondary | ICD-10-CM | POA: Insufficient documentation

## 2021-07-31 LAB — COMPREHENSIVE METABOLIC PANEL
ALT: 41 U/L (ref 0–44)
AST: 24 U/L (ref 15–41)
Albumin: 4.5 g/dL (ref 3.5–5.0)
Alkaline Phosphatase: 48 U/L (ref 38–126)
Anion gap: 12 (ref 5–15)
BUN: 10 mg/dL (ref 6–20)
CO2: 20 mmol/L — ABNORMAL LOW (ref 22–32)
Calcium: 9.5 mg/dL (ref 8.9–10.3)
Chloride: 105 mmol/L (ref 98–111)
Creatinine, Ser: 0.98 mg/dL (ref 0.61–1.24)
GFR, Estimated: >60 mL/min (ref >60)
Glucose, Bld: 106 mg/dL — ABNORMAL HIGH (ref 70–99)
Potassium: 3.4 mmol/L — ABNORMAL LOW (ref 3.5–5.1)
Sodium: 137 mmol/L (ref 135–145)
Total Bilirubin: 1.1 mg/dL (ref 0.3–1.2)
Total Protein: 8.1 g/dL (ref 6.5–8.1)

## 2021-07-31 LAB — CBC WITH DIFFERENTIAL/PLATELET
Abs Immature Granulocytes: 0.07 10*3/uL (ref 0.00–0.07)
Basophils Absolute: 0 10*3/uL (ref 0.0–0.1)
Basophils Relative: 0 %
Eosinophils Absolute: 0 10*3/uL (ref 0.0–0.5)
Eosinophils Relative: 0 %
HCT: 43.6 % (ref 39.0–52.0)
Hemoglobin: 14.9 g/dL (ref 13.0–17.0)
Immature Granulocytes: 1 %
Lymphocytes Relative: 21 %
Lymphs Abs: 2.1 10*3/uL (ref 0.7–4.0)
MCH: 29.7 pg (ref 26.0–34.0)
MCHC: 34.2 g/dL (ref 30.0–36.0)
MCV: 86.9 fL (ref 80.0–100.0)
Monocytes Absolute: 0.8 10*3/uL (ref 0.1–1.0)
Monocytes Relative: 8 %
Neutro Abs: 7.2 10*3/uL (ref 1.7–7.7)
Neutrophils Relative %: 70 %
Platelets: 420 10*3/uL — ABNORMAL HIGH (ref 150–400)
RBC: 5.02 MIL/uL (ref 4.22–5.81)
RDW: 14 % (ref 11.5–15.5)
WBC: 10.2 10*3/uL (ref 4.0–10.5)
nRBC: 0 % (ref 0.0–0.2)

## 2021-07-31 LAB — LIPASE, BLOOD: Lipase: 25 U/L (ref 11–51)

## 2021-07-31 MED ORDER — SODIUM CHLORIDE 0.9 % IV BOLUS
1000.0000 mL | Freq: Once | INTRAVENOUS | Status: AC
  Administered 2021-07-31: 1000 mL via INTRAVENOUS

## 2021-07-31 MED ORDER — ONDANSETRON HCL 4 MG/2ML IJ SOLN
4.0000 mg | Freq: Once | INTRAMUSCULAR | Status: AC
  Administered 2021-07-31: 4 mg via INTRAVENOUS
  Filled 2021-07-31: qty 2

## 2021-07-31 MED ORDER — HALOPERIDOL LACTATE 5 MG/ML IJ SOLN: 2.5000 mg | Freq: Once | INTRAMUSCULAR | Status: DC

## 2021-07-31 MED ORDER — IOHEXOL 300 MG/ML  SOLN
100.0000 mL | Freq: Once | INTRAMUSCULAR | Status: AC | PRN
  Administered 2021-07-31: 100 mL via INTRAVENOUS

## 2021-07-31 MED ORDER — FAMOTIDINE IN NACL 20-0.9 MG/50ML-% IV SOLN
20.0000 mg | Freq: Once | INTRAVENOUS | Status: AC
  Administered 2021-07-31: 20 mg via INTRAVENOUS
  Filled 2021-07-31: qty 50

## 2021-07-31 MED ORDER — HALOPERIDOL LACTATE 5 MG/ML IJ SOLN
2.5000 mg | Freq: Once | INTRAMUSCULAR | Status: AC
Start: 2021-07-31 — End: 2021-07-31
  Administered 2021-07-31: 2.5 mg via INTRAVENOUS
  Filled 2021-07-31: qty 1

## 2021-07-31 NOTE — ED Notes (Signed)
States he feels much better after IV Zofran, no longer demonstrating any retching or vomiting.

## 2021-07-31 NOTE — ED Notes (Signed)
Phoned notified , she was unaware that pt left  with IV , she stated pt was picked up by his mother, instructed wife to have pt return to ED to have IV removed. Reported to security and [employer] that wife new of location of pt and wife's number given to [employer].

## 2021-07-31 NOTE — ED Notes (Signed)
Pt called x3 by two techs and phlebotomy, no answer from pt

## 2021-07-31 NOTE — ED Provider Notes (Signed)
Was made aware that patient eloped from the ED by nursing staff.  Patient was primarily seen by physician associate.  I had not evaluated the patient.  Sounds like patient was here for nausea and vomiting and abdominal pain.  He had lab work and imaging that was unremarkable.  However patient eloped with his IV still in place as nursing staff was unable to locate IV in the room.  Security cameras were evaluated and it sounds like patient got dressed and was able to make his way out the front door of the emergency department and out into the streets.  He had originally come in with family sounds like.  Please have been notified to try to locate this patient so that IV can be removed.  Does not appear that he was suicidal homicidal.  His chart does state he has a history of alcohol abuse and polysubstance abuse.   Virgina Norfolk, DO 07/31/21 1418

## 2021-07-31 NOTE — ED Notes (Addendum)
Pt not found in room , BR , lobby, IV not found on bed or trash. IV fluid  bolus running onto bed. Charge nurse notified , Dr Kym Groom Notified. Security notify. [employer] dispatch notified with description of  and name and made aware that pt left with IV in place and d/t HX of polysubstance abuse pt needs to be found and IV removed.

## 2021-07-31 NOTE — ED Notes (Signed)
Pt returned from CT complaining of nausea. Provider notified

## 2021-07-31 NOTE — ED Notes (Signed)
No answer x2 

## 2021-07-31 NOTE — ED Notes (Signed)
This NT went into room to swab the Pt. Patient was no where to be found, checked bathrooms, etc. IV pole unattached from IV, gown found in chair. Agricultural consultant notified.

## 2021-07-31 NOTE — ED Notes (Signed)
ED Provider at bedside. 

## 2021-07-31 NOTE — ED Provider Notes (Signed)
[facility] EMERGENCY DEPARTMENT Provider Note   CSN: [id number] Arrival date & time: 07/31/21  1152     History  Chief Complaint  Patient presents with   Abdominal Pain    Jason Cruz is a 26 y.o. male.  HPI  Patient presents with diffuse abdominal pain, nausea and vomiting.  He said having the symptoms off and on for the last week, was seen at [facility] yesterday.  He initially felt better after GI cocktail and some fluids, he reports when he got home he was still unable to take any medicine.  He has a history of alcohol use disorder, last drink was about a month ago.  He is a daily marijuana smoker, denies any cigarette use.  No prior abdominal surgeries, called GI this morning and they were calling to schedule an outpatient follow-up.  He reports he did have an endoscopy done a few months ago which showed gastric ulcers.  Patient endorses occasional hematemesis about a week ago, has not had any since then.  Denies any rectal bleeding or blood in the stool or dark tarry stool.  Past Medical History:  Diagnosis Date   ETOH abuse    Polysubstance abuse (HCC)      Home Medications Prior to Admission medications   Medication Sig Start Date End Date Taking? Authorizing Provider  acetaminophen (TYLENOL) 325 MG tablet Take 2 tablets (650 mg total) by mouth every 6 (six) hours as needed for up to 30 doses for fever, moderate pain, mild pain or headache. 07/10/21   Trifan, Kermit Balo, MD  acetaminophen (TYLENOL) 500 MG tablet Take 500 mg by mouth every 6 (six) hours as needed for mild pain, moderate pain or headache.    [provider]  benzonatate (TESSALON) 100 MG capsule Take 1 capsule (100 mg total) by mouth every 8 (eight) hours. 07/11/21   Redwine, Madison A, PA-C  docusate sodium (COLACE) 100 MG capsule Take 1 capsule (100 mg total) by mouth 2 (two) times daily. While taking narcotic pain medicine. 10/31/20   Jacinta Shoe, PA-C  folic acid (FOLVITE) 1 MG tablet  Take 1 tablet (1 mg total) by mouth daily. 11/11/20   Joseph Art, DO  ibuprofen (ADVIL) 800 MG tablet Take 1 tablet (800 mg total) by mouth every 8 (eight) hours as needed for up to 21 doses. 07/10/21   Terald Sleeper, MD  ondansetron (ZOFRAN ODT) 4 MG disintegrating tablet Take 1 tablet (4 mg total) by mouth every 8 (eight) hours as needed for nausea or vomiting. 07/14/21   Gloris Manchester, MD  pantoprazole (PROTONIX) 40 MG tablet Take 1 tablet (40 mg total) by mouth daily. 07/30/21 08/29/21  Charlynne Pander, MD  sucralfate (CARAFATE) 1 g tablet Take 1 tablet (1 g total) by mouth 4 (four) times daily -  with meals and at bedtime. 07/30/21   Charlynne Pander, MD  thiamine 100 MG tablet Take 1 tablet (100 mg total) by mouth daily. 11/11/20   Joseph Art, DO  traMADol (ULTRAM) 50 MG tablet Take 50-100 mg by mouth as needed for moderate pain or severe pain. 11/05/20   [provider]      Allergies    Patient has no known allergies.    Review of Systems   Review of Systems Per HPI  Physical Exam Updated Vital Signs BP (!) 122/56    Pulse 92    Temp 98 F (36.7 C) (Oral)    Resp 20  Wt 73.5 kg    SpO2 100%    BMI 26.15 kg/m  Physical Exam Vitals and nursing note reviewed. Exam conducted with a chaperone present.  Constitutional:      Appearance: Normal appearance.  HENT:     Head: Normocephalic and atraumatic.  Eyes:     General: No scleral icterus.       Right eye: No discharge.        Left eye: No discharge.     Extraocular Movements: Extraocular movements intact.     Pupils: Pupils are equal, round, and reactive to light.  Cardiovascular:     Rate and Rhythm: Normal rate and regular rhythm.     Pulses: Normal pulses.     Heart sounds: Normal heart sounds. No murmur heard.   No friction rub. No gallop.  Pulmonary:     Effort: Pulmonary effort is normal. No respiratory distress.     Breath sounds: Normal breath sounds.  Abdominal:     General: Abdomen is flat.  Bowel sounds are normal. There is no distension.     Palpations: Abdomen is soft.     Tenderness: There is abdominal tenderness in the epigastric area. There is no guarding or rebound.  Skin:    General: Skin is warm and dry.     Coloration: Skin is not jaundiced.  Neurological:     Mental Status: He is alert. Mental status is at baseline.     Coordination: Coordination normal.    ED Results / Procedures / Treatments   Labs (all labs ordered are listed, but only abnormal results are displayed) Labs Reviewed  COMPREHENSIVE METABOLIC PANEL - Abnormal; Notable for the following components:      Result Value   Potassium 3.4 (*)    CO2 20 (*)    Glucose, Bld 106 (*)    All other components within normal limits  CBC WITH DIFFERENTIAL/PLATELET - Abnormal; Notable for the following components:   Platelets 420 (*)    All other components within normal limits  LIPASE, BLOOD    EKG None  Radiology CT ABDOMEN PELVIS W CONTRAST  Result Date: 07/31/2021 CLINICAL DATA:  Abdominal pain, nausea and vomiting EXAM: CT ABDOMEN AND PELVIS WITH CONTRAST TECHNIQUE: Multidetector CT imaging of the abdomen and pelvis was performed using the standard protocol following bolus administration of intravenous contrast. CONTRAST:  100mL OMNIPAQUE IOHEXOL 300 MG/ML  SOLN COMPARISON:  12/20/2020. FINDINGS: Lower chest: No focal pulmonary opacity. No pleural or pericardial effusion. Hepatobiliary: The liver is normal in contour. The hepatic and portal veins are patent. No intra or extrahepatic biliary ductal dilatation. The gallbladder is unremarkable. Pancreas: Unremarkable. No pancreatic ductal dilatation or surrounding inflammatory changes. Spleen: Normal in size without focal abnormality. Adrenals/Urinary Tract: The adrenal glands are unremarkable. The kidneys enhance symmetrically with no hydronephrosis. The bladder is unremarkable for degree of underdistention. Stomach/Bowel: Small hiatal hernia. The bowel is  normal in caliber with no evidence of obstruction. The appendix is unremarkable. No distention or wall thickening. Vascular/Lymphatic: No significant vascular findings are present. No enlarged abdominal or pelvic lymph nodes. Reproductive: Prostate is unremarkable. Other: No free fluid or free air in the abdomen or pelvis. No abdominal abnormality. Musculoskeletal: No acute or significant osseous findings. IMPRESSION: 1. Small hiatal hernia. 2. No acute process in the abdomen or pelvis. No etiology seen for the patient's abdominal pain, nausea, or vomiting. Electronically Signed   By: Wiliam KeAlison  Vasan M.D.   On: 07/31/2021 13:03    Procedures Procedures  Medications Ordered in ED Medications  sodium chloride 0.9 % bolus 1,000 mL (1,000 mLs Intravenous New Bag/Given 07/31/21 1235)  famotidine (PEPCID) IVPB 20 mg premix (0 mg Intravenous Stopped 07/31/21 1239)  ondansetron (ZOFRAN) injection 4 mg (4 mg Intravenous Given 07/31/21 1234)  iohexol (OMNIPAQUE) 300 MG/ML solution 100 mL (100 mLs Intravenous Contrast Given 07/31/21 1248)  haloperidol lactate (HALDOL) injection 2.5 mg (2.5 mg Intravenous Given 07/31/21 1313)    ED Course/ Medical Decision Making/ A&P                           Medical Decision Making  This is a 26 year old male with history of alcohol use disorder, polysubstance use disorder, gastric ulcers and alcoholic gastritis presenting due to nausea and vomiting.  He was seen yesterday for the symptoms and initially improved, was discharged with sucralfate and Protonix but has not been able to keep the medicine down.  Epigastric tenderness noted but there is no rigidity or guarding concerning for peritoneal signs.    Additional history was obtained from reading previous ED note, chart review and review of previous lab findings.  We will give fluids, antiemetics and recheck labs.  CT abdomen ordered due to worsening epigastric pain.  Patient is a daily cannabis user, cannabis hyperemesis  is also on the differential.  Likely secondary to alcoholic gastritis, patient does not have any black tarry stools making an acute GI bleed unlikely.  Additionally patient is not hypotensive or tachycardic so does not appear to be hemodynamically unstable.  Patient given fluids, Zofran for nausea and vomiting.  Initially no improvement with Zofran, Haldol ordered instead.  This alleviated his symptoms, unfortunately the patient eloped prior to my reevaluation.  Previous labs, labs ordered today and imaging ordered today were reviewed by myself personally.  I agree with the radiologist interpretation of the CT abdomen ordered today.  Laboratory work-up today remains unchanged from yesterday, no gross leukocytosis or anemia.  There is no gross electrolyte derangement, AKI from dehydration or LFT derangement.  CT abdomen is unremarkable without any signs of pancreatitis or concerning intra-abdominal pathology.  Patient vitals remained stable, his nausea improved after the Haldol.    Unfortunately, patient eloped prior to being reevaluated.  He did elope with IV in his arm, when I saw him he did not voice any concerns of homicidal or suicidal ideations.  He also denies any recent illicit drug use, police/security were notified by the RN.        Final Clinical Impression(s) / ED Diagnoses Final diagnoses:  Nausea and vomiting, unspecified vomiting type    Rx / DC Orders ED Discharge Orders     None         Theron Arista, PA-C 07/31/21 1430    Virgina Norfolk, DO 07/31/21 1431

## 2021-07-31 NOTE — ED Triage Notes (Addendum)
Pt c/o abd pain, n/v-seen here yesterday for same-grimacing-steady gait-[facility] [place] PTA

## 2021-08-02 ENCOUNTER — Emergency Department (HOSPITAL_BASED_OUTPATIENT_CLINIC_OR_DEPARTMENT_OTHER)
Admission: EM | Admit: 2021-08-02 | Discharge: 2021-08-02 | Disposition: A | Discharge status: Home / Self Care | Payer: Self-pay | Attending: Emergency Medicine | Admitting: Emergency Medicine

## 2021-08-02 ENCOUNTER — Encounter (HOSPITAL_BASED_OUTPATIENT_CLINIC_OR_DEPARTMENT_OTHER): Payer: Self-pay | Admitting: Emergency Medicine

## 2021-08-02 ENCOUNTER — Other Ambulatory Visit: Payer: Self-pay

## 2021-08-02 DIAGNOSIS — R1084 Generalized abdominal pain: Secondary | ICD-10-CM | POA: Insufficient documentation

## 2021-08-02 DIAGNOSIS — R112 Nausea with vomiting, unspecified: Secondary | ICD-10-CM | POA: Insufficient documentation

## 2021-08-02 LAB — CBC WITH DIFFERENTIAL/PLATELET
Abs Immature Granulocytes: 0.06 10*3/uL (ref 0.00–0.07)
Basophils Absolute: 0 10*3/uL (ref 0.0–0.1)
Basophils Relative: 0 %
Eosinophils Absolute: 0 10*3/uL (ref 0.0–0.5)
Eosinophils Relative: 0 %
HCT: 43.2 % (ref 39.0–52.0)
Hemoglobin: 14.9 g/dL (ref 13.0–17.0)
Immature Granulocytes: 1 %
Lymphocytes Relative: 17 %
Lymphs Abs: 1.7 10*3/uL (ref 0.7–4.0)
MCH: 29.7 pg (ref 26.0–34.0)
MCHC: 34.5 g/dL (ref 30.0–36.0)
MCV: 86.1 fL (ref 80.0–100.0)
Monocytes Absolute: 0.8 10*3/uL (ref 0.1–1.0)
Monocytes Relative: 8 %
Neutro Abs: 7.3 10*3/uL (ref 1.7–7.7)
Neutrophils Relative %: 74 %
Platelets: 353 10*3/uL (ref 150–400)
RBC: 5.02 MIL/uL (ref 4.22–5.81)
RDW: 14 % (ref 11.5–15.5)
WBC: 9.8 10*3/uL (ref 4.0–10.5)
nRBC: 0 % (ref 0.0–0.2)

## 2021-08-02 LAB — COMPREHENSIVE METABOLIC PANEL
ALT: 32 U/L (ref 0–44)
AST: 21 U/L (ref 15–41)
Albumin: 4.6 g/dL (ref 3.5–5.0)
Alkaline Phosphatase: 50 U/L (ref 38–126)
Anion gap: 12 (ref 5–15)
BUN: 16 mg/dL (ref 6–20)
CO2: 23 mmol/L (ref 22–32)
Calcium: 9.5 mg/dL (ref 8.9–10.3)
Chloride: 104 mmol/L (ref 98–111)
Creatinine, Ser: 1.01 mg/dL (ref 0.61–1.24)
GFR, Estimated: >60 mL/min (ref >60)
Glucose, Bld: 112 mg/dL — ABNORMAL HIGH (ref 70–99)
Potassium: 3.7 mmol/L (ref 3.5–5.1)
Sodium: 139 mmol/L (ref 135–145)
Total Bilirubin: 1.1 mg/dL (ref 0.3–1.2)
Total Protein: 8.3 g/dL — ABNORMAL HIGH (ref 6.5–8.1)

## 2021-08-02 LAB — LIPASE, BLOOD: Lipase: 27 U/L (ref 11–51)

## 2021-08-02 MED ORDER — PANTOPRAZOLE SODIUM 40 MG IV SOLR
40.0000 mg | Freq: Once | INTRAVENOUS | Status: AC
Start: 2021-08-02 — End: 2021-08-02
  Administered 2021-08-02: 40 mg via INTRAVENOUS
  Filled 2021-08-02: qty 40

## 2021-08-02 MED ORDER — ONDANSETRON HCL 4 MG/2ML IJ SOLN
4.0000 mg | Freq: Once | INTRAMUSCULAR | Status: AC
  Administered 2021-08-02: 4 mg via INTRAVENOUS
  Filled 2021-08-02: qty 2

## 2021-08-02 MED ORDER — ONDANSETRON 4 MG PO TBDP: ORAL_TABLET | ORAL | 0 refills | Status: AC

## 2021-08-02 MED ORDER — SODIUM CHLORIDE 0.9 % IV BOLUS
1000.0000 mL | Freq: Once | INTRAVENOUS | Status: AC
  Administered 2021-08-02: 1000 mL via INTRAVENOUS

## 2021-08-02 NOTE — ED Provider Notes (Signed)
[facility] EMERGENCY DEPARTMENT Provider Note   CSN: [id number] Arrival date & time: 08/02/21  1023     History  Chief Complaint  Patient presents with   Abdominal Pain    RANIEL DIGGES is a 26 y.o. male.  Patient is a 26 year old male who presents with abdominal pain associated nausea and vomiting.  He has a history of EtOH abuse and prior peptic ulcer disease with admission for GI bleed.  He also has history of polysubstance use and daily marijuana use.  He has had some ongoing problems with abdominal pain associated with nausea and vomiting.  Most recent episode has been going on for about a week.  He was seen here 2 days ago and had labs with a CT scan.  He said he eloped from the emergency department.  He said his anxiety got bad and he felt like he had to leave.  Has had some ongoing nausea and vomiting.  His emesis is nonbloody and nonbilious.  No associated diarrhea.  No fevers.  His symptoms are similar to his prior episodes.  He has an upcoming appointment with gastroenterology on January 24.  He has not been able to keep down his PPI or other medications in the last 4 days.      Home Medications Prior to Admission medications   Medication Sig Start Date End Date Taking? Authorizing Provider  ondansetron (ZOFRAN-ODT) 4 MG disintegrating tablet 4mg  ODT q4 hours prn nausea/vomit 08/02/21  Yes Malvin Johns, MD  acetaminophen (TYLENOL) 325 MG tablet Take 2 tablets (650 mg total) by mouth every 6 (six) hours as needed for up to 30 doses for fever, moderate pain, mild pain or headache. 07/10/21   Trifan, Carola Rhine, MD  acetaminophen (TYLENOL) 500 MG tablet Take 500 mg by mouth every 6 (six) hours as needed for mild pain, moderate pain or headache.    [provider]  benzonatate (TESSALON) 100 MG capsule Take 1 capsule (100 mg total) by mouth every 8 (eight) hours. 07/11/21   Redwine, Madison A, PA-C  docusate sodium (COLACE) 100 MG capsule Take 1 capsule (100 mg  total) by mouth 2 (two) times daily. While taking narcotic pain medicine. 10/31/20   Corky Sing, PA-C  folic acid (FOLVITE) 1 MG tablet Take 1 tablet (1 mg total) by mouth daily. 11/11/20   Geradine Girt, DO  ibuprofen (ADVIL) 800 MG tablet Take 1 tablet (800 mg total) by mouth every 8 (eight) hours as needed for up to 21 doses. 07/10/21   Wyvonnia Dusky, MD  pantoprazole (PROTONIX) 40 MG tablet Take 1 tablet (40 mg total) by mouth daily. 07/30/21 08/29/21  Drenda Freeze, MD  sucralfate (CARAFATE) 1 g tablet Take 1 tablet (1 g total) by mouth 4 (four) times daily -  with meals and at bedtime. 07/30/21   Drenda Freeze, MD  thiamine 100 MG tablet Take 1 tablet (100 mg total) by mouth daily. 11/11/20   Geradine Girt, DO  traMADol (ULTRAM) 50 MG tablet Take 50-100 mg by mouth as needed for moderate pain or severe pain. 11/05/20   [provider]      Allergies    Ibuprofen    Review of Systems   Review of Systems  Constitutional:  Negative for chills, diaphoresis, fatigue and fever.  HENT:  Negative for congestion, rhinorrhea and sneezing.   Eyes: Negative.   Respiratory:  Negative for cough, chest tightness and shortness of breath.   Cardiovascular:  Negative for chest pain and leg swelling.  Gastrointestinal:  Positive for abdominal pain, nausea and vomiting. Negative for blood in stool and diarrhea.  Genitourinary:  Negative for difficulty urinating, flank pain, frequency and hematuria.  Musculoskeletal:  Negative for arthralgias and back pain.  Skin:  Negative for rash.  Neurological:  Negative for dizziness, speech difficulty, weakness, numbness and headaches.   Physical Exam Updated Vital Signs BP 112/63    Pulse 62    Temp 98.1 F (36.7 C) (Oral)    Resp 16    SpO2 99%  Physical Exam Constitutional:      Appearance: He is well-developed.  HENT:     Head: Normocephalic and atraumatic.  Eyes:     Pupils: Pupils are equal, round, and reactive to light.   Cardiovascular:     Rate and Rhythm: Normal rate and regular rhythm.     Heart sounds: Normal heart sounds.  Pulmonary:     Effort: Pulmonary effort is normal. No respiratory distress.     Breath sounds: Normal breath sounds. No wheezing or rales.  Chest:     Chest wall: No tenderness.  Abdominal:     General: Bowel sounds are normal.     Palpations: Abdomen is soft.     Tenderness: There is generalized abdominal tenderness. There is no guarding or rebound.  Musculoskeletal:        General: Normal range of motion.     Cervical back: Normal range of motion and neck supple.  Lymphadenopathy:     Cervical: No cervical adenopathy.  Skin:    General: Skin is warm and dry.     Findings: No rash.  Neurological:     Mental Status: He is alert and oriented to person, place, and time.    ED Results / Procedures / Treatments   Labs (all labs ordered are listed, but only abnormal results are displayed) Labs Reviewed  COMPREHENSIVE METABOLIC PANEL - Abnormal; Notable for the following components:      Result Value   Glucose, Bld 112 (*)    Total Protein 8.3 (*)    All other components within normal limits  CBC WITH DIFFERENTIAL/PLATELET  LIPASE, BLOOD    EKG None  Radiology No results found.  Procedures Procedures    Medications Ordered in ED Medications  sodium chloride 0.9 % bolus 1,000 mL (0 mLs Intravenous Stopped 08/02/21 1341)  ondansetron (ZOFRAN) injection 4 mg (4 mg Intravenous Given 08/02/21 1240)  pantoprazole (PROTONIX) injection 40 mg (40 mg Intravenous Given 08/02/21 1241)    ED Course/ Medical Decision Making/ A&P                           Medical Decision Making  Patient is a 26 year old male with a history of recurrent episodes of abdominal pain associated nausea and vomiting.  It is felt to be related to his peptic ulcer disease and or other substance abuse/alcohol issues as well as possible cannabis hyperemesis.  He is having a similar flareup today.   His labs were drawn.  I reviewed these and they are nonconcerning.  I reviewed his old records.  He was given IV fluids and Zofran as well as Protonix.  He is feeling better following this.  He is able to drink fluids without vomiting.  He has some nonconcerning abdominal exam.  He had a CT scan on chart review that was done 2 days ago.  This showed no concerning findings.  I do not feel that this needs to be repeated today.  I discussed the findings with the patient and his mom.  He has an upcoming appointment with gastroenterology in 10 days.  Return precautions were given.  Final Clinical Impression(s) / ED Diagnoses Final diagnoses:  Generalized abdominal pain  Nausea and vomiting, unspecified vomiting type    Rx / DC Orders ED Discharge Orders          Ordered    ondansetron (ZOFRAN-ODT) 4 MG disintegrating tablet        08/02/21 1455              Malvin Johns, MD 08/02/21 1457

## 2021-08-02 NOTE — ED Triage Notes (Signed)
Pt c/o abdominal pain and n/v since 0300 today

## 2021-08-02 NOTE — Discharge Instructions (Signed)
Follow-up with your gastroenterologist as discussed.  Get started back on your Protonix.  Return here as needed if you have any worsening symptoms.

## 2021-08-03 ENCOUNTER — Emergency Department (HOSPITAL_BASED_OUTPATIENT_CLINIC_OR_DEPARTMENT_OTHER)
Admission: EM | Admit: 2021-08-03 | Discharge: 2021-08-03 | Disposition: A | Discharge status: Home / Self Care | Payer: Self-pay | Attending: Emergency Medicine | Admitting: Emergency Medicine

## 2021-08-03 ENCOUNTER — Encounter (HOSPITAL_BASED_OUTPATIENT_CLINIC_OR_DEPARTMENT_OTHER): Payer: Self-pay | Admitting: Emergency Medicine

## 2021-08-03 DIAGNOSIS — R112 Nausea with vomiting, unspecified: Secondary | ICD-10-CM

## 2021-08-03 DIAGNOSIS — R1084 Generalized abdominal pain: Secondary | ICD-10-CM | POA: Insufficient documentation

## 2021-08-03 LAB — CBC WITH DIFFERENTIAL/PLATELET
Abs Immature Granulocytes: 0.07 10*3/uL (ref 0.00–0.07)
Basophils Absolute: 0 10*3/uL (ref 0.0–0.1)
Basophils Relative: 0 %
Eosinophils Absolute: 0 10*3/uL (ref 0.0–0.5)
Eosinophils Relative: 0 %
HCT: 42 % (ref 39.0–52.0)
Hemoglobin: 14.3 g/dL (ref 13.0–17.0)
Immature Granulocytes: 1 %
Lymphocytes Relative: 20 %
Lymphs Abs: 1.9 10*3/uL (ref 0.7–4.0)
MCH: 29.4 pg (ref 26.0–34.0)
MCHC: 34 g/dL (ref 30.0–36.0)
MCV: 86.2 fL (ref 80.0–100.0)
Monocytes Absolute: 0.8 10*3/uL (ref 0.1–1.0)
Monocytes Relative: 9 %
Neutro Abs: 6.4 10*3/uL (ref 1.7–7.7)
Neutrophils Relative %: 70 %
Platelets: 327 10*3/uL (ref 150–400)
RBC: 4.87 MIL/uL (ref 4.22–5.81)
RDW: 14.3 % (ref 11.5–15.5)
WBC: 9.2 10*3/uL (ref 4.0–10.5)
nRBC: 0 % (ref 0.0–0.2)

## 2021-08-03 LAB — COMPREHENSIVE METABOLIC PANEL
ALT: 28 U/L (ref 0–44)
AST: 24 U/L (ref 15–41)
Albumin: 4.4 g/dL (ref 3.5–5.0)
Alkaline Phosphatase: 45 U/L (ref 38–126)
Anion gap: 11 (ref 5–15)
BUN: 14 mg/dL (ref 6–20)
CO2: 21 mmol/L — ABNORMAL LOW (ref 22–32)
Calcium: 9.5 mg/dL (ref 8.9–10.3)
Chloride: 104 mmol/L (ref 98–111)
Creatinine, Ser: 1.01 mg/dL (ref 0.61–1.24)
GFR, Estimated: >60 mL/min (ref >60)
Glucose, Bld: 101 mg/dL — ABNORMAL HIGH (ref 70–99)
Potassium: 3.3 mmol/L — ABNORMAL LOW (ref 3.5–5.1)
Sodium: 136 mmol/L (ref 135–145)
Total Bilirubin: 0.9 mg/dL (ref 0.3–1.2)
Total Protein: 7.5 g/dL (ref 6.5–8.1)

## 2021-08-03 LAB — LIPASE, BLOOD: Lipase: 28 U/L (ref 11–51)

## 2021-08-03 MED ORDER — HALOPERIDOL LACTATE 5 MG/ML IJ SOLN
2.0000 mg | Freq: Once | INTRAMUSCULAR | Status: AC
Start: 2021-08-03 — End: 2021-08-03
  Administered 2021-08-03: 2 mg via INTRAVENOUS
  Filled 2021-08-03: qty 1

## 2021-08-03 MED ORDER — SODIUM CHLORIDE 0.9 % IV BOLUS
1000.0000 mL | Freq: Once | INTRAVENOUS | Status: AC
  Administered 2021-08-03: 1000 mL via INTRAVENOUS

## 2021-08-03 MED ORDER — ONDANSETRON HCL 4 MG/2ML IJ SOLN
4.0000 mg | Freq: Once | INTRAMUSCULAR | Status: AC
  Administered 2021-08-03: 4 mg via INTRAVENOUS
  Filled 2021-08-03: qty 2

## 2021-08-03 MED ORDER — SUCRALFATE 1 GM/10ML PO SUSP: 1.0000 g | Freq: Three times a day (TID) | ORAL | 0 refills | Status: AC

## 2021-08-03 MED ORDER — PANTOPRAZOLE SODIUM 40 MG IV SOLR
40.0000 mg | Freq: Once | INTRAVENOUS | Status: AC
  Administered 2021-08-03: 40 mg via INTRAVENOUS
  Filled 2021-08-03: qty 40

## 2021-08-03 NOTE — ED Notes (Signed)
Pt continues to have N/V and abd pain.  Informed EDP

## 2021-08-03 NOTE — Discharge Instructions (Signed)
Use the Zofran and your other medications as discussed.  Follow-up with your gastroenterologist as discussed.  Return to emergency room if you have any worsening symptoms.

## 2021-08-03 NOTE — ED Notes (Signed)
PO fluids given to patient for oral challenge

## 2021-08-03 NOTE — ED Provider Notes (Signed)
[facility] Provider Note   CSN: [id number] Arrival date & time: 08/03/21  [id number]     History  Chief Complaint  Patient presents with   Emesis    Jason Cruz is a 26 y.o. male.  Patient is a 26 year old male who presents with abdominal pain associated with nausea and vomiting.  He was seen here yesterday.  He has a history of recurrent episodes of abdominal pain associated with nausea and vomiting.  He has been evaluated multiple times.  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  He has an upcoming appointment with GI on January 24.  He says that every day he wakes up with abdominal pain associate with nausea and vomiting.  He says it really happens every morning.  I asked him if he still drinking alcohol and he says he has not had any alcohol in about 4 weeks.  I asked him if he still using marijuana and he says that he does not want to talk about.  He indicates that he does not believe that marijuana would be contributing to his recurrent symptoms.  He says that he has not been able to take the Zofran that was prescribed.  He sometimes he gets a PPI in.  He is not taking his Carafate because he says the pills are too big.      Home Medications Prior to Admission medications   Medication Sig Start Date End Date Taking? Authorizing Provider  sucralfate (CARAFATE) 1 GM/10ML suspension Take 10 mLs (1 g total) by mouth 4 (four) times daily -  with meals and at bedtime. 08/03/21  Yes Malvin Johns, MD  acetaminophen (TYLENOL) 325 MG tablet Take 2 tablets (650 mg total) by mouth every 6 (six) hours as needed for up to 30 doses for fever, moderate pain, mild pain or headache. 07/10/21   Trifan, Carola Rhine, MD  acetaminophen (TYLENOL) 500 MG tablet Take 500 mg by mouth every 6 (six) hours as needed for mild pain, moderate pain or headache.    [provider]  benzonatate (TESSALON) 100  MG capsule Take 1 capsule (100 mg total) by mouth every 8 (eight) hours. 07/11/21   Redwine, Madison A, PA-C  docusate sodium (COLACE) 100 MG capsule Take 1 capsule (100 mg total) by mouth 2 (two) times daily. While taking narcotic pain medicine. 10/31/20   Corky Sing, PA-C  folic acid (FOLVITE) 1 MG tablet Take 1 tablet (1 mg total) by mouth daily. 11/11/20   Geradine Girt, DO  ibuprofen (ADVIL) 800 MG tablet Take 1 tablet (800 mg total) by mouth every 8 (eight) hours as needed for up to 21 doses. 07/10/21   Wyvonnia Dusky, MD  ondansetron (ZOFRAN-ODT) 4 MG disintegrating tablet 4mg  ODT q4 hours prn nausea/vomit 08/02/21   Malvin Johns, MD  oxyCODONE-acetaminophen (PERCOCET/ROXICET) 5-325 MG tablet oxycodone-acetaminophen 5 mg-325 mg tablet  TAKE 1-2 TABLETS BY MOUTH EVERY 8 (EIGHT) HOURS AS NEEDED FOR SEVERE PAIN.    [provider]  pantoprazole (PROTONIX) 40 MG tablet Take 1 tablet (40 mg total) by mouth daily. 07/30/21 08/29/21  Drenda Freeze, MD  promethazine-dextromethorphan (PROMETHAZINE-DM) 6.25-15 MG/5ML syrup Take 5 mLs by mouth 4 (four) times daily as needed. 07/23/21   [provider]  thiamine 100 MG tablet Take 1 tablet (100 mg total) by mouth daily. 11/11/20   Geradine Girt, DO  traMADol Veatrice Bourbon) 50  MG tablet Take 50-100 mg by mouth as needed for moderate pain or severe pain. 11/05/20   [provider]      Allergies    Ibuprofen    Review of Systems   Review of Systems  Constitutional:  Negative for chills, diaphoresis, fatigue and fever.  HENT:  Negative for congestion, rhinorrhea and sneezing.   Eyes: Negative.   Respiratory:  Negative for cough, chest tightness and shortness of breath.   Cardiovascular:  Negative for chest pain and leg swelling.  Gastrointestinal:  Positive for abdominal pain, nausea and vomiting. Negative for blood in stool and diarrhea.       No hematemesis  Genitourinary:  Negative for difficulty urinating, flank  pain, frequency and hematuria.  Musculoskeletal:  Negative for arthralgias and back pain.  Skin:  Negative for rash.  Neurological:  Negative for dizziness, speech difficulty, weakness, numbness and headaches.   Physical Exam Updated Vital Signs BP 133/82    Pulse 65    Temp 98.4 F (36.9 C) (Oral)    Resp 16    Ht 5\' 6"  (1.676 m)    Wt 72.6 kg    SpO2 100%    BMI 25.82 kg/m  Physical Exam Constitutional:      Appearance: He is well-developed.  HENT:     Head: Normocephalic and atraumatic.  Eyes:     Pupils: Pupils are equal, round, and reactive to light.  Cardiovascular:     Rate and Rhythm: Normal rate and regular rhythm.     Heart sounds: Normal heart sounds.  Pulmonary:     Effort: Pulmonary effort is normal. No respiratory distress.     Breath sounds: Normal breath sounds. No wheezing or rales.  Chest:     Chest wall: No tenderness.  Abdominal:     General: Bowel sounds are normal.     Palpations: Abdomen is soft.     Tenderness: There is abdominal tenderness (Generalized). There is no guarding or rebound.  Musculoskeletal:        General: Normal range of motion.     Cervical back: Normal range of motion and neck supple.  Lymphadenopathy:     Cervical: No cervical adenopathy.  Skin:    General: Skin is warm and dry.     Findings: No rash.  Neurological:     Mental Status: He is alert and oriented to person, place, and time.    ED Results / Procedures / Treatments   Labs (all labs ordered are listed, but only abnormal results are displayed) Labs Reviewed  COMPREHENSIVE METABOLIC PANEL - Abnormal; Notable for the following components:      Result Value   Potassium 3.3 (*)    CO2 21 (*)    Glucose, Bld 101 (*)    All other components within normal limits  LIPASE, BLOOD  CBC WITH DIFFERENTIAL/PLATELET    EKG EKG Interpretation  Date/Time:  Sunday August 03 2021 11:11:13 EST Ventricular Rate:  68 PR Interval:  132 QRS Duration: 97 QT Interval:  413 QTC  Calculation: 440 R Axis:   83 Text Interpretation: Sinus rhythm since last tracing no significant change Confirmed by Malvin Johns [phone]) on 08/03/2021 11:46:55 AM  Radiology No results found.  Procedures Procedures    Medications Ordered in ED Medications  sodium chloride 0.9 % bolus 1,000 mL (0 mLs Intravenous Stopped 08/03/21 1020)  ondansetron (ZOFRAN) injection 4 mg (4 mg Intravenous Given 08/03/21 0859)  pantoprazole (PROTONIX) injection 40 mg (40 mg Intravenous Given  08/03/21 0938)  haloperidol lactate (HALDOL) injection 2 mg (2 mg Intravenous Given 08/03/21 1126)    ED Course/ Medical Decision Making/ A&P                           Medical Decision Making  Patient presents with recurrent episode of abdominal pain with nausea and vomiting.  He says it happens daily.  His chart was reviewed.  His labs were drawn and are nonconcerning.  These were interpreted by me.  He did have an EKG in anticipation of medication administration and showed no QT prolongation or other concerning findings.  He was given IV fluids and Zofran along with Protonix.  He initially was not showing signs of improvement and Haldol was ordered although he did not get this and said he was feeling much better.  He tolerated a p.o. trial without any ongoing vomiting or abdominal pain.  Given this, I considered admission but does not seem appropriate given that his symptoms have resolved.  I discussed options with him.  He does not want to discuss marijuana cessation.  He does not like to take his Carafate because the pills are too big so I did discuss switching this to liquid form and he is opting for that.  I discussed trying Phenergan suppositories but at this point he does not want this.  He will continue with ODT Zofran.  He will follow-up with his gastroenterologist as discussed.  Return precautions were given.  Final Clinical Impression(s) / ED Diagnoses Final diagnoses:  Generalized abdominal pain  Nausea and  vomiting, unspecified vomiting type    Rx / DC Orders ED Discharge Orders          Ordered    sucralfate (CARAFATE) 1 GM/10ML suspension  3 times daily with meals & bedtime        08/03/21 1319              Malvin Johns, MD 08/03/21 1322

## 2021-08-03 NOTE — ED Triage Notes (Signed)
Pt arrives pov with c/o abdominal pain  and nausea

## 2021-08-04 ENCOUNTER — Encounter (HOSPITAL_BASED_OUTPATIENT_CLINIC_OR_DEPARTMENT_OTHER): Payer: Self-pay

## 2021-08-04 ENCOUNTER — Other Ambulatory Visit: Payer: Self-pay

## 2021-08-04 ENCOUNTER — Emergency Department (HOSPITAL_BASED_OUTPATIENT_CLINIC_OR_DEPARTMENT_OTHER)
Admission: EM | Admit: 2021-08-04 | Discharge: 2021-08-04 | Disposition: A | Discharge status: Home / Self Care | Payer: Self-pay | Attending: Emergency Medicine | Admitting: Emergency Medicine

## 2021-08-04 ENCOUNTER — Emergency Department (HOSPITAL_COMMUNITY)
Admission: EM | Admit: 2021-08-04 | Discharge: 2021-08-04 | Discharge status: Absent without leave | Payer: Self-pay | Source: Home / Self Care

## 2021-08-04 DIAGNOSIS — R1110 Vomiting, unspecified: Secondary | ICD-10-CM | POA: Insufficient documentation

## 2021-08-04 DIAGNOSIS — R1111 Vomiting without nausea: Secondary | ICD-10-CM

## 2021-08-04 MED ORDER — CAPSAICIN 0.025 % EX CREA
TOPICAL_CREAM | Freq: Once | CUTANEOUS | Status: AC
  Filled 2021-08-04: qty 60

## 2021-08-04 MED ORDER — PANTOPRAZOLE SODIUM 40 MG PO TBEC: 40.0000 mg | DELAYED_RELEASE_TABLET | Freq: Two times a day (BID) | ORAL | 0 refills | Status: DC

## 2021-08-04 MED ORDER — ONDANSETRON 4 MG PO TBDP
4.0000 mg | ORAL_TABLET | Freq: Once | ORAL | Status: AC
  Administered 2021-08-04: 4 mg via ORAL
  Filled 2021-08-04: qty 1

## 2021-08-04 MED ORDER — HALOPERIDOL LACTATE 5 MG/ML IJ SOLN
1.0000 mg | Freq: Once | INTRAMUSCULAR | Status: AC
Start: 2021-08-04 — End: 2021-08-04
  Administered 2021-08-04: 1 mg via INTRAMUSCULAR
  Filled 2021-08-04: qty 1

## 2021-08-04 MED ORDER — PANTOPRAZOLE SODIUM 40 MG PO TBEC
40.0000 mg | DELAYED_RELEASE_TABLET | Freq: Once | ORAL | Status: AC
  Administered 2021-08-04: 40 mg via ORAL
  Filled 2021-08-04: qty 1

## 2021-08-04 NOTE — ED Provider Notes (Addendum)
[facility] Provider Note   CSN: [id number] Arrival date & time: 08/04/21  0920     History  Chief Complaint  Patient presents with   Emesis    Jason Cruz is a 26 y.o. male.  Returns to the emergency department with ongoing vomiting episodes.  History of the same.  Using Zofran at home.  Taking Protonix.  Prescribed Carafate suspension yesterday.  No bloody stools or vomit.  Has follow-up with GI next week. Admits to marijuana use today.  The history is provided by the patient.  Emesis Severity:  Mild Duration: 3. Timing:  Intermittent Chronicity:  Recurrent Recent urination:  Normal Relieved by:  Nothing Worsened by:  Nothing Associated symptoms: no abdominal pain, no arthralgias, no chills, no cough, no diarrhea, no fever, no headaches, no myalgias, no sore throat and no URI       Home Medications Prior to Admission medications   Medication Sig Start Date End Date Taking? Authorizing Provider  acetaminophen (TYLENOL) 325 MG tablet Take 2 tablets (650 mg total) by mouth every 6 (six) hours as needed for up to 30 doses for fever, moderate pain, mild pain or headache. 07/10/21   Trifan, Carola Rhine, MD  acetaminophen (TYLENOL) 500 MG tablet Take 500 mg by mouth every 6 (six) hours as needed for mild pain, moderate pain or headache.    [provider]  benzonatate (TESSALON) 100 MG capsule Take 1 capsule (100 mg total) by mouth every 8 (eight) hours. 07/11/21   Redwine, Madison A, PA-C  docusate sodium (COLACE) 100 MG capsule Take 1 capsule (100 mg total) by mouth 2 (two) times daily. While taking narcotic pain medicine. 10/31/20   Corky Sing, PA-C  folic acid (FOLVITE) 1 MG tablet Take 1 tablet (1 mg total) by mouth daily. 11/11/20   Geradine Girt, DO  ibuprofen (ADVIL) 800 MG tablet Take 1 tablet (800 mg total) by mouth every 8 (eight) hours as needed for up to 21 doses. 07/10/21   Wyvonnia Dusky, MD  ondansetron (ZOFRAN-ODT) 4  MG disintegrating tablet 4mg  ODT q4 hours prn nausea/vomit 08/02/21   Malvin Johns, MD  oxyCODONE-acetaminophen (PERCOCET/ROXICET) 5-325 MG tablet oxycodone-acetaminophen 5 mg-325 mg tablet  TAKE 1-2 TABLETS BY MOUTH EVERY 8 (EIGHT) HOURS AS NEEDED FOR SEVERE PAIN.    [provider]  pantoprazole (PROTONIX) 40 MG tablet Take 1 tablet (40 mg total) by mouth 2 (two) times daily. 08/04/21 09/03/21  Kaeo Jacome, DO  promethazine-dextromethorphan (PROMETHAZINE-DM) 6.25-15 MG/5ML syrup Take 5 mLs by mouth 4 (four) times daily as needed. 07/23/21   [provider]  sucralfate (CARAFATE) 1 GM/10ML suspension Take 10 mLs (1 g total) by mouth 4 (four) times daily -  with meals and at bedtime. 08/03/21   Malvin Johns, MD  thiamine 100 MG tablet Take 1 tablet (100 mg total) by mouth daily. 11/11/20   Geradine Girt, DO  traMADol (ULTRAM) 50 MG tablet Take 50-100 mg by mouth as needed for moderate pain or severe pain. 11/05/20   [provider]      Allergies    Ibuprofen    Review of Systems   Review of Systems  Constitutional:  Negative for chills and fever.  HENT:  Negative for sore throat.   Respiratory:  Negative for cough.   Gastrointestinal:  Positive for vomiting. Negative for abdominal pain and diarrhea.  Musculoskeletal:  Negative for arthralgias and myalgias.  Neurological:  Negative for headaches.  Physical Exam Updated Vital Signs BP 139/90 (BP Location: Right Arm)    Pulse 70    Temp 98.1 F (36.7 C) (Oral)    Resp 18    Ht 5\' 6"  (1.676 m)    Wt 72.6 kg    SpO2 98%    BMI 25.82 kg/m  Physical Exam Vitals and nursing note reviewed.  Constitutional:      General: He is not in acute distress.    Appearance: He is well-developed. He is not ill-appearing.  HENT:     Head: Normocephalic and atraumatic.     Nose: Nose normal.     Mouth/Throat:     Mouth: Mucous membranes are moist.  Eyes:     Extraocular Movements: Extraocular movements intact.      Conjunctiva/sclera: Conjunctivae normal.     Pupils: Pupils are equal, round, and reactive to light.  Cardiovascular:     Rate and Rhythm: Normal rate and regular rhythm.     Pulses: Normal pulses.     Heart sounds: Normal heart sounds. No murmur heard. Pulmonary:     Effort: Pulmonary effort is normal. No respiratory distress.     Breath sounds: Normal breath sounds.  Abdominal:     Palpations: Abdomen is soft.     Tenderness: There is no abdominal tenderness.  Musculoskeletal:        General: No swelling.     Cervical back: Normal range of motion and neck supple.  Skin:    General: Skin is warm and dry.     Capillary Refill: Capillary refill takes less than 2 seconds.  Neurological:     General: No focal deficit present.     Mental Status: He is alert.  Psychiatric:        Mood and Affect: Mood normal.    ED Results / Procedures / Treatments   Labs (all labs ordered are listed, but only abnormal results are displayed) Labs Reviewed - No data to display  EKG None  Radiology No results found.  Procedures Procedures    Medications Ordered in ED Medications  capsaicin (ZOSTRIX) 0.025 % cream (has no administration in time range)  ondansetron (ZOFRAN-ODT) disintegrating tablet 4 mg (has no administration in time range)  haloperidol lactate (HALDOL) injection 1 mg (has no administration in time range)  pantoprazole (PROTONIX) EC tablet 40 mg (has no administration in time range)    ED Course/ Medical Decision Making/ A&P                           Medical Decision Making  Jason Cruz is here with nausea and vomiting.  History of the same, polysubstance abuse, alcohol abuse.  Normal vitals today.  No fever.  Multiple ED visits this past week.  He has had normal labs with no evidence of dehydration upon my chart review.  He also had a CT scan several days ago that was unremarkable as well.  My physician assistant is seen him several days ago when patient eloped with an  IV.  He came in with same complaint and there was concern for malingering.  When he had the opportunity to leave after family member left the room patient was found wandering the emergency department trying to leave and eventually did.  Patient left with his IV and overall I am concerned about possible malingering today.  He has had several ED visits now.  Was seen yesterday and given Zofran and Protonix.  Was switched to liquid Carafate.  He overall appears comfortable.  He denies any melena or hematochezia.  Abdominal exam is benign.  Did review his EGD report from about a year ago that did show some evidence of hiatal hernia, gastritis, nonbleeding Mallory-Weiss tears.  He is clear breath sounds on exam.  No concern for esophageal rupture or GI bleeding.  Differential diagnosis less likely esophageal rupture, GI bleeding and and suspect acute on chronic gastritis, hyperemesis from marijuana use, malingering.  At this time given that patient appears comfortable with normal vitals and blood work done yesterday that showed no concerning features and CT scan several days ago that showed no concerning features do not feel like lab or imaging are indicated at this time.  Abdominal exam is benign.  He is not having any melena or hematochezia.  Patient given a dose of capsaicin, Haldol, Zofran, Protonix.  He is taking 20 mg of Protonix daily however we will have him take 40 mg twice daily for 2 weeks.  Continue Carafate.  On reevaluation patient improved.  He understands medication adjustments.  He has follow-up with gastroenterology in place.  He understands return precautions.  This chart was dictated using voice recognition software.  Despite best efforts to proofread,  errors can occur which can change the documentation meaning.         Final Clinical Impression(s) / ED Diagnoses Final diagnoses:  Vomiting without nausea, unspecified vomiting type    Rx / DC Orders ED Discharge Orders           Ordered    pantoprazole (PROTONIX) 40 MG tablet  2 times daily        08/04/21 0935              Lennice Sites, DO 08/04/21 [id number]    Lennice Sites, DO 08/04/21 0945

## 2021-08-04 NOTE — ED Notes (Signed)
Protonix scanned in and placed on bedside table with water. Pt informed that when anti nausea meds start working that he can take the protonix. Pt verbalizes agreement

## 2021-08-04 NOTE — Discharge Instructions (Signed)
Increase your Protonix to 40 mg twice a day.  Pick up your new prescription when you run out of the tablets that you currently have.  Continue Carafate as prescribed.

## 2021-08-04 NOTE — ED Triage Notes (Addendum)
C/o nausea/vomiting. States took prescribed meds this morning, continues to have vomiting. Denies pain. Seen here yesterday for same. States smoked marijuana prior to arrival

## 2021-08-04 NOTE — ED Notes (Signed)
Pt tolerating PO without further vomiting. Ambulatory at time of discharge with mother without distress

## 2021-12-18 ENCOUNTER — Other Ambulatory Visit (HOSPITAL_BASED_OUTPATIENT_CLINIC_OR_DEPARTMENT_OTHER): Payer: Self-pay

## 2021-12-18 ENCOUNTER — Encounter (HOSPITAL_BASED_OUTPATIENT_CLINIC_OR_DEPARTMENT_OTHER): Payer: Self-pay

## 2021-12-18 ENCOUNTER — Other Ambulatory Visit: Payer: Self-pay

## 2021-12-18 ENCOUNTER — Emergency Department (HOSPITAL_BASED_OUTPATIENT_CLINIC_OR_DEPARTMENT_OTHER)
Admission: EM | Admit: 2021-12-18 | Discharge: 2021-12-18 | Disposition: A | Discharge status: Home / Self Care | Payer: 59 | Attending: Emergency Medicine | Admitting: Emergency Medicine

## 2021-12-18 DIAGNOSIS — L0201 Cutaneous abscess of face: Secondary | ICD-10-CM | POA: Diagnosis present

## 2021-12-18 MED ORDER — SULFAMETHOXAZOLE-TRIMETHOPRIM 800-160 MG PO TABS
1.0000 | ORAL_TABLET | Freq: Two times a day (BID) | ORAL | 0 refills | Status: AC
  Filled 2021-12-18: qty 14, 7d supply, fill #0

## 2021-12-18 MED ORDER — KETOROLAC TROMETHAMINE 60 MG/2ML IM SOLN
60.0000 mg | Freq: Once | INTRAMUSCULAR | Status: AC
Start: 2021-12-18 — End: 2021-12-18
  Administered 2021-12-18: 60 mg via INTRAMUSCULAR
  Filled 2021-12-18: qty 2

## 2021-12-18 MED ORDER — OXYCODONE HCL 5 MG PO TABS
5.0000 mg | ORAL_TABLET | ORAL | 0 refills | Status: AC | PRN
  Filled 2021-12-18: qty 8, 2d supply, fill #0

## 2021-12-18 MED ORDER — ACETAMINOPHEN 500 MG PO TABS
1000.0000 mg | ORAL_TABLET | Freq: Once | ORAL | Status: AC
  Administered 2021-12-18: 1000 mg via ORAL
  Filled 2021-12-18: qty 2

## 2021-12-18 NOTE — ED Provider Notes (Signed)
[facility] EMERGENCY DEPARTMENT Provider Note   CSN: [id number] Arrival date & time: 12/18/21  7829     History  Chief Complaint  Patient presents with   Abscess    Jason Cruz is a 26 y.o. male.  Patient as above with significant medical history as below, including prior alcohol and polysubstance abuse who presents to the ED with complaint of facial pain, swelling.  Patient reports for the past 3 days he noticed increased pain and discomfort to his right upper lip.  No recent trauma or wound there.  Pain especially worsened, swelling worsened.  Localized to the right upper lip.  Some swelling has lateralized on the right side as well.  No difficulty swallowing.  No difficulty opening or closing his mouth, no voice changes, no breathing changes.  No fevers or chills.  No nausea or vomiting.  He is tolerant p.o. intake without difficulty.  He took Excedrin at home last night without significant improvement to his symptoms.     Past Medical History:  Diagnosis Date   ETOH abuse    Polysubstance abuse (HCC)     Past Surgical History:  Procedure Laterality Date   ESOPHAGOGASTRODUODENOSCOPY (EGD) WITH PROPOFOL N/A 11/09/2020   Procedure: ESOPHAGOGASTRODUODENOSCOPY (EGD) WITH PROPOFOL;  Surgeon: Napoleon Form, MD;  Location: [facility];  Service: Endoscopy;  Laterality: N/A;   ORIF ANKLE FRACTURE Left 10/31/2020   Procedure: Open Reduction Internal Fixation (ORIF) Left trimalleolar fracture;  Surgeon: Toni Arthurs, MD;  Location: [facility];  Service: Orthopedics;  Laterality: Left;     The history is provided by the patient. No language interpreter was used.  Abscess Associated symptoms: no fever, no headaches, no nausea and no vomiting       Home Medications Prior to Admission medications   Medication Sig Start Date End Date Taking? Authorizing Provider  oxyCODONE (ROXICODONE) 5 MG immediate release tablet Take 1 tablet (5 mg total) by mouth every  4 (four) hours as needed for severe pain. 12/18/21  Yes Tanda Rockers A, DO  sulfamethoxazole-trimethoprim (BACTRIM DS) 800-160 MG tablet Take 1 tablet by mouth 2 (two) times daily for 7 days. 12/18/21 12/25/21 Yes Sloan Leiter, DO  acetaminophen (TYLENOL) 325 MG tablet Take 2 tablets (650 mg total) by mouth every 6 (six) hours as needed for up to 30 doses for fever, moderate pain, mild pain or headache. 07/10/21   Trifan, Kermit Balo, MD  acetaminophen (TYLENOL) 500 MG tablet Take 500 mg by mouth every 6 (six) hours as needed for mild pain, moderate pain or headache.    [provider]  benzonatate (TESSALON) 100 MG capsule Take 1 capsule (100 mg total) by mouth every 8 (eight) hours. 07/11/21   Redwine, Madison A, PA-C  docusate sodium (COLACE) 100 MG capsule Take 1 capsule (100 mg total) by mouth 2 (two) times daily. While taking narcotic pain medicine. 10/31/20   Jacinta Shoe, PA-C  folic acid (FOLVITE) 1 MG tablet Take 1 tablet (1 mg total) by mouth daily. 11/11/20   Joseph Art, DO  ibuprofen (ADVIL) 800 MG tablet Take 1 tablet (800 mg total) by mouth every 8 (eight) hours as needed for up to 21 doses. 07/10/21   Terald Sleeper, MD  ondansetron (ZOFRAN-ODT) 4 MG disintegrating tablet  ODT q4 hours prn nausea/vomit 08/02/21   Rolan Bucco, MD  oxyCODONE-acetaminophen (PERCOCET/ROXICET) 5-325 MG tablet oxycodone-acetaminophen 5 mg-325 mg tablet  TAKE 1-2 TABLETS BY MOUTH EVERY 8 (EIGHT) HOURS  AS NEEDED FOR SEVERE PAIN.    [provider]  pantoprazole (PROTONIX) 40 MG tablet Take 1 tablet (40 mg total) by mouth 2 (two) times daily. 08/04/21 09/03/21  Curatolo, Adam, DO  promethazine-dextromethorphan (PROMETHAZINE-DM) 6.25-15 MG/5ML syrup Take 5 mLs by mouth 4 (four) times daily as needed. 07/23/21   [provider]  sucralfate (CARAFATE) 1 GM/10ML suspension Take 10 mLs (1 g total) by mouth 4 (four) times daily -  with meals and at bedtime. 08/03/21   Rolan Bucco, MD   thiamine 100 MG tablet Take 1 tablet (100 mg total) by mouth daily. 11/11/20   Joseph Art, DO  traMADol (ULTRAM) 50 MG tablet Take 50-100 mg by mouth as needed for moderate pain or severe pain. 11/05/20   [provider]      Allergies    Ibuprofen    Review of Systems   Review of Systems  Constitutional:  Negative for chills and fever.  HENT:  Positive for facial swelling. Negative for trouble swallowing.   Eyes:  Negative for photophobia and visual disturbance.  Respiratory:  Negative for cough and shortness of breath.   Cardiovascular:  Negative for chest pain and palpitations.  Gastrointestinal:  Negative for abdominal pain, nausea and vomiting.  Endocrine: Negative for polydipsia and polyuria.  Genitourinary:  Negative for difficulty urinating and hematuria.  Musculoskeletal:  Negative for gait problem and joint swelling.  Skin:  Positive for color change. Negative for pallor and rash.  Neurological:  Negative for syncope and headaches.  Psychiatric/Behavioral:  Negative for agitation and confusion.    Physical Exam Updated Vital Signs BP 134/84 (BP Location: Right Arm)   Pulse 71   Temp 98.2 F (36.8 C) (Oral)   Resp 16   Ht 5\' 6"  (1.676 m)   Wt 79.4 kg   SpO2 99%   BMI 28.25 kg/m  Physical Exam Vitals and nursing note reviewed.  Constitutional:      General: He is not in acute distress.    Appearance: He is well-developed.  HENT:     Head: Normocephalic and atraumatic.     Jaw: There is normal jaw occlusion. No trismus, tenderness or swelling.      Comments: No drooling stridor or trismus     Right Ear: External ear normal.     Left Ear: External ear normal.     Mouth/Throat:     Mouth: Mucous membranes are moist.  Eyes:     General: No scleral icterus.    Extraocular Movements:     Right eye: Normal extraocular motion.     Left eye: Normal extraocular motion.     Conjunctiva/sclera: Conjunctivae normal.     Comments: No pain with eye  movement, no periorbital swelling  Cardiovascular:     Rate and Rhythm: Normal rate and regular rhythm.     Pulses: Normal pulses.     Heart sounds: Normal heart sounds.  Pulmonary:     Effort: Pulmonary effort is normal. No respiratory distress.     Breath sounds: Normal breath sounds.  Abdominal:     General: Abdomen is flat.     Palpations: Abdomen is soft.     Tenderness: There is no abdominal tenderness.  Musculoskeletal:        General: Normal range of motion.     Cervical back: Full passive range of motion without pain and normal range of motion.     Right lower leg: No edema.     Left lower  leg: No edema.  Skin:    General: Skin is warm and dry.     Capillary Refill: Capillary refill takes less than 2 seconds.  Neurological:     Mental Status: He is alert and oriented to person, place, and time.  Psychiatric:        Mood and Affect: Mood normal.        Behavior: Behavior normal.    ED Results / Procedures / Treatments   Labs (all labs ordered are listed, but only abnormal results are displayed) Labs Reviewed - No data to display  EKG None  Radiology No results found.  Procedures Procedures    Medications Ordered in ED Medications  ketorolac (TORADOL) injection 60 mg (60 mg Intramuscular Given 12/18/21 0814)  acetaminophen (TYLENOL) tablet 1,000 mg (1,000 mg Oral Given 12/18/21 [id number])    ED Course/ Medical Decision Making/ A&P                           Medical Decision Making Risk OTC drugs. Prescription drug management.    CC: Facial pain, swelling  This patient presents to the Emergency Department for the above complaint. This involves an extensive number of treatment options and is a complaint that carries with it a high risk of complications and morbidity. Vital signs were reviewed. Serious etiologies considered.  DDx includes not limited to, dental infection, periapical abscess, dental abscess, facial abscess, cellulitis, Ludwig angina, preseptal  versus septal cellulitis.  Other acute etiologies considered  Record review:  Previous records obtained and reviewed prior ED visits, prior labs and imaging  Additional history obtained from N/A  Medical and surgical history as noted above.   Work up as above, notable for:   Exam patient has what appears to be a superficial subcutaneous abscess on his face.  Appears to be centralized around a hair follicle.  Discussed possible bedside I&D with the patient, patient not interested in I&D at this time given that the abscesses on his face.  Will trial antibiotics, analgesics, supportive care at home.  Follow-up with PCP/ ENT.  Reassessment:  Patient feeling better after analgesics  Admission was considered.   Patient given strict return precautions  He does not appear septic.  He is tolerating p.o. intake with difficulty.  No drooling, stridor or trismus, no dysphonia or dysphagia.  No pain with eye movement  No IVDU  The patient improved significantly and was discharged in stable condition. Detailed discussions were had with the patient regarding current findings, and need for close f/u with PCP or on call doctor. The patient has been instructed to return immediately if the symptoms worsen in any way for re-evaluation. Patient verbalized understanding and is in agreement with current care plan. All questions answered prior to discharge.               Social determinants of health include -  Social History   Socioeconomic History   Marital status: Married    Spouse name: Not on file   Number of children: Not on file   Years of education: Not on file   Highest education level: Not on file  Occupational History   Not on file  Tobacco Use   Smoking status: Never   Smokeless tobacco: Never  Vaping Use   Vaping Use: Never used  Substance and Sexual Activity   Alcohol use: Not Currently   Drug use: Yes    Types: Marijuana   Sexual activity: Not  on file  Other Topics  Concern   Not on file  Social History Narrative   Not on file   Social Determinants of Health   Financial Resource Strain: Not on file  Food Insecurity: Not on file  Transportation Needs: Not on file  Physical Activity: Not on file  Stress: Not on file  Social Connections: Not on file  Intimate Partner Violence: Not on file      This chart was dictated using voice recognition software.  Despite best efforts to proofread,  errors can occur which can change the documentation meaning.         Final Clinical Impression(s) / ED Diagnoses Final diagnoses:  Facial abscess    Rx / DC Orders ED Discharge Orders          Ordered    sulfamethoxazole-trimethoprim (BACTRIM DS) 800-160 MG tablet  2 times daily        12/18/21 0812    oxyCODONE (ROXICODONE) 5 MG immediate release tablet  Every 4 hours PRN        12/18/21 0812              Sloan Leiter, DO 12/18/21 [phone]

## 2021-12-18 NOTE — Discharge Instructions (Addendum)
It was a pleasure caring for you today in the emergency department.  Please return to the emergency department for any worsening or worrisome symptoms.  If symptoms worsen please return to the ER.  Especially if you develop difficulty swallowing, difficulty moving your eye or pain in your right eye.  Difficulty speaking.  Worsening redness or swelling.

## 2021-12-18 NOTE — ED Triage Notes (Signed)
Pt states he thought he had a pimple on right side of face near lip x 3 days. Now has swelling to right cheek and upper lip. Denies dental pain.

## 2023-05-26 IMAGING — CT CT ABD-PELV W/ CM
2 of 4 series · 16 of 46 positions shown, 18 images · IV contrast (Omnipaque)
Comparison: 12/20/2020.

CLINICAL DATA: Abdominal pain, nausea and vomiting

EXAM:
CT ABDOMEN AND PELVIS WITH CONTRAST
TECHNIQUE: Multidetector CT imaging of the abdomen and pelvis was performed
using the standard protocol following bolus administration of
intravenous contrast.
CONTRAST:  100mL OMNIPAQUE IOHEXOL 300 MG/ML  SOLN

[Series 2: axial st · axial · 0.90mm/px · z∈[+694,+1174]mm · 13 of 106 slices shown, 15 images]
[im 5/106  soft-tissue]
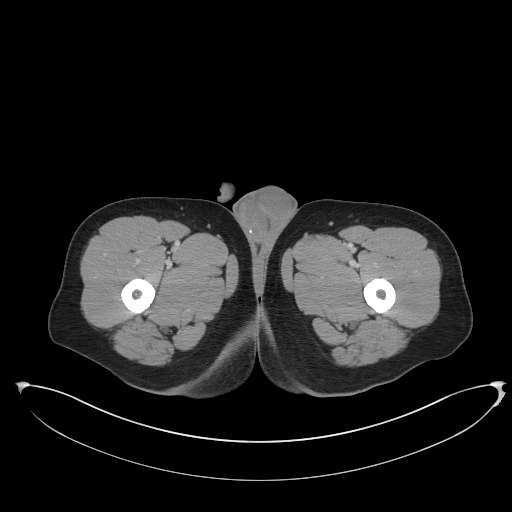
[im 5/106  bone]
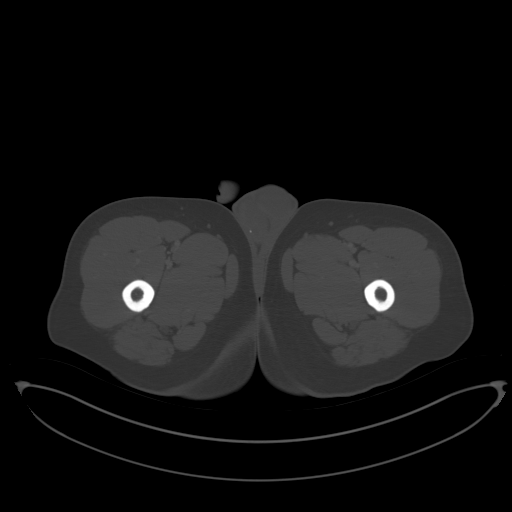
[im 14/106  soft-tissue]
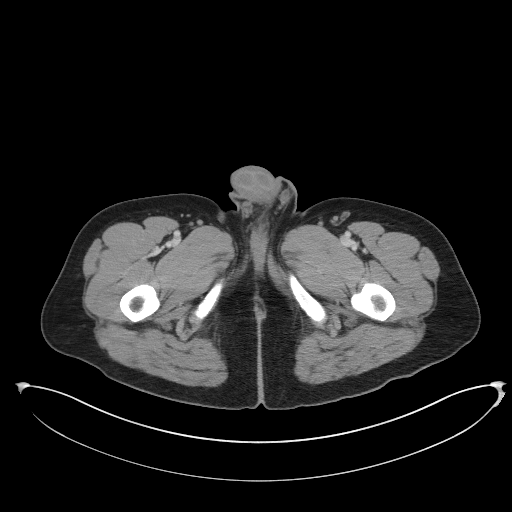
[im 22/106  soft-tissue]
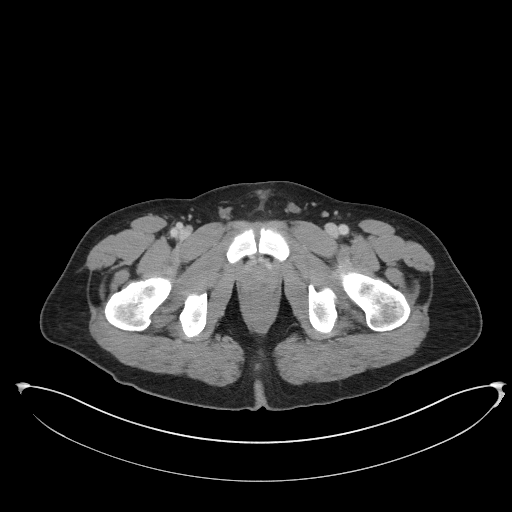
[im 31/106  soft-tissue]
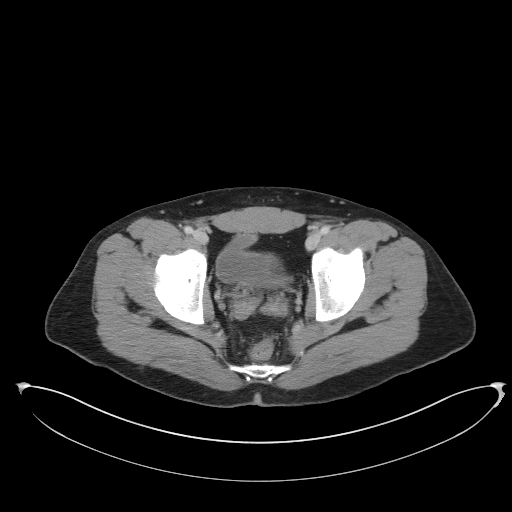
[im 36/106  soft-tissue]
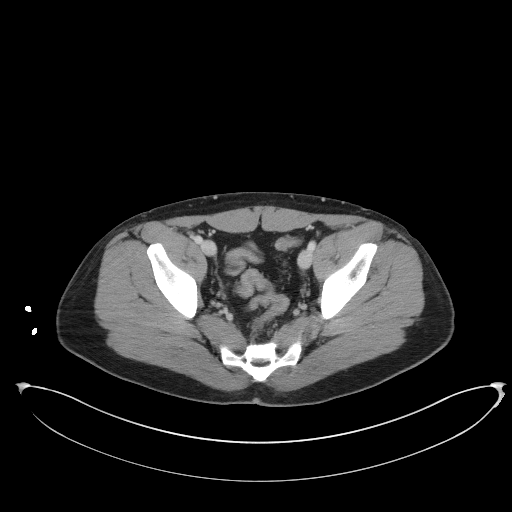
[im 44/106  soft-tissue]
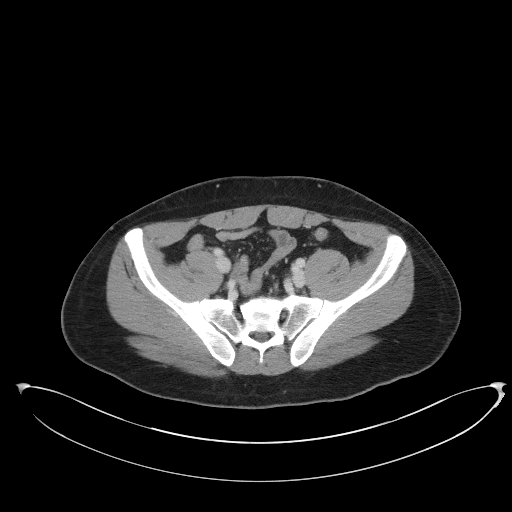
[im 53/106  soft-tissue]
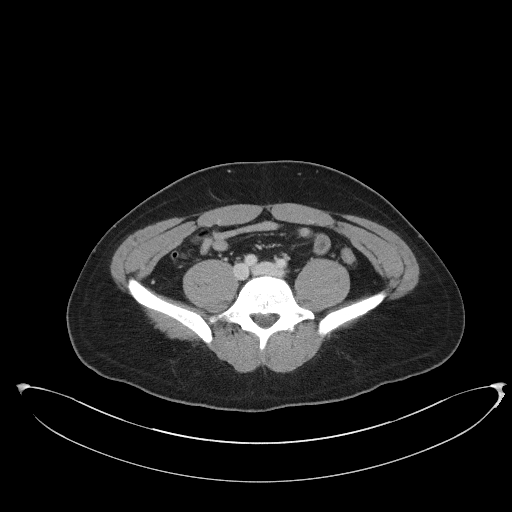
[im 62/106  soft-tissue]
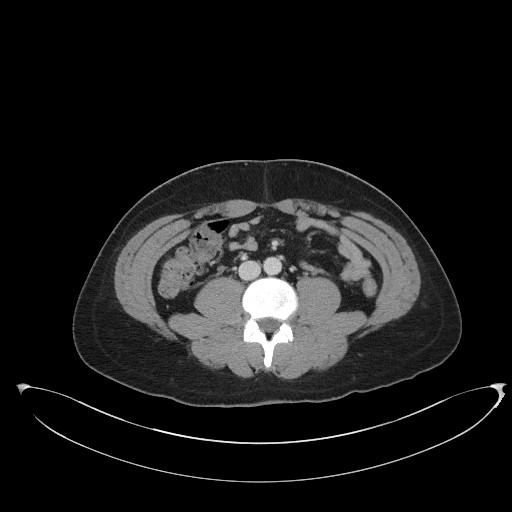
[im 71/106  soft-tissue]
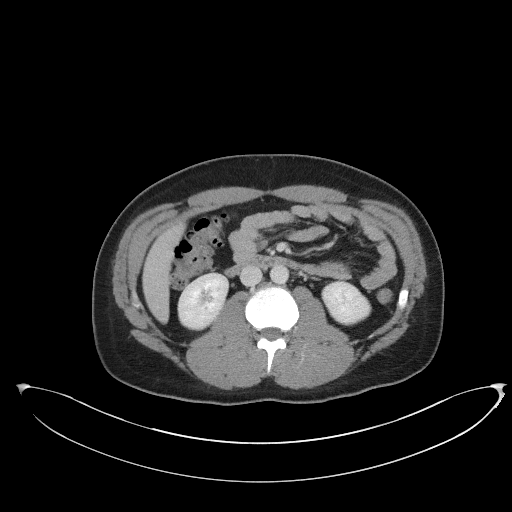
[im 71/106  bone]
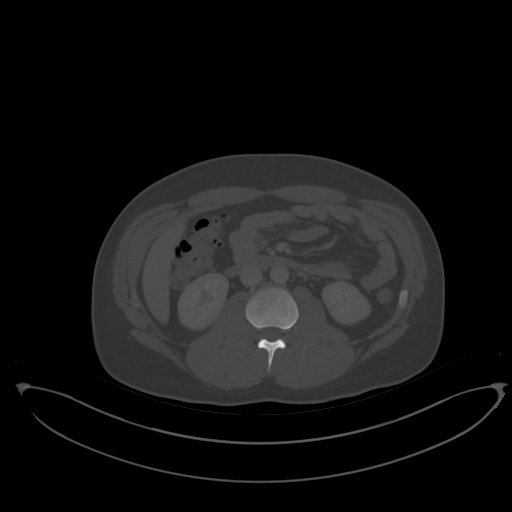
[im 75/106  soft-tissue]
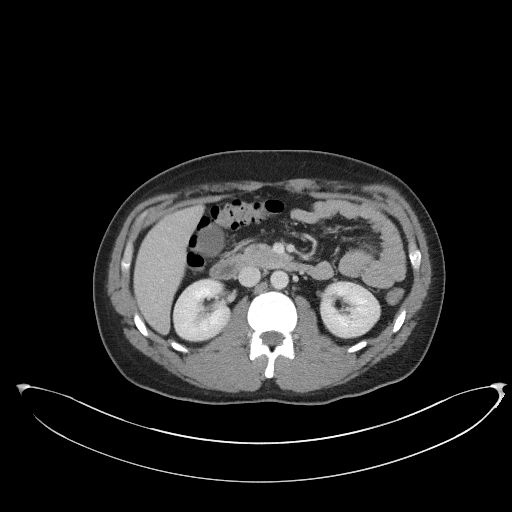
[im 84/106  soft-tissue]
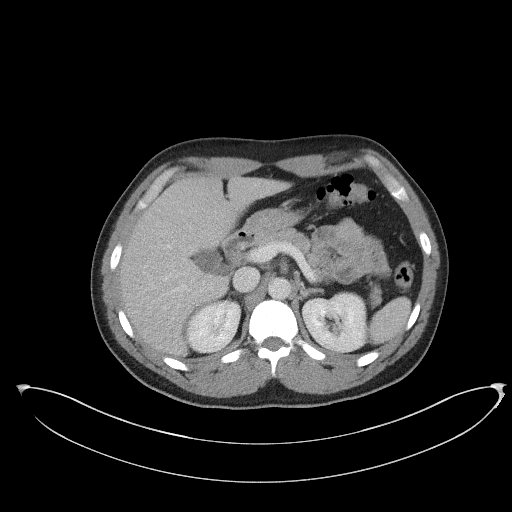
[im 92/106  soft-tissue]
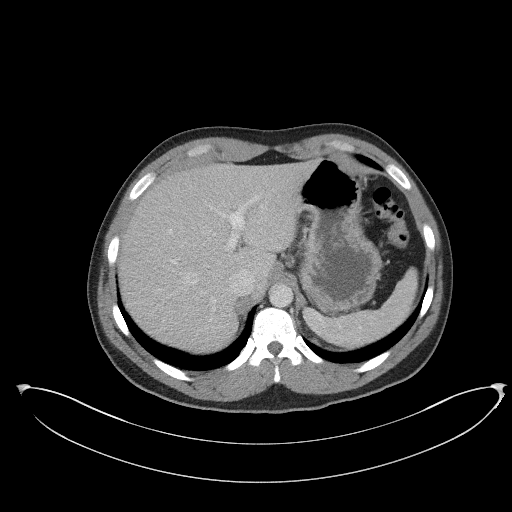
[im 101/106  soft-tissue]
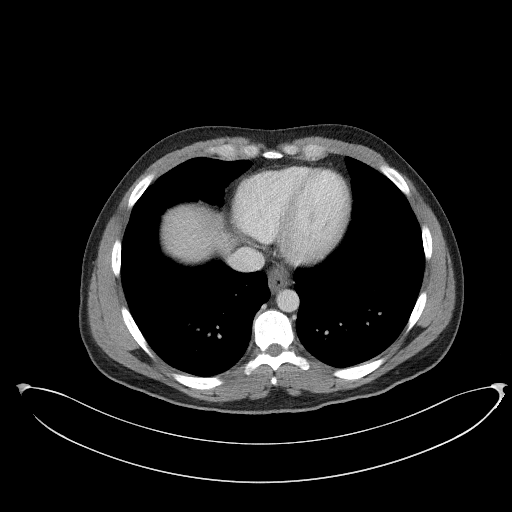

[Series 5: coronal st · coronal · 0.78mm/px · 3 of 86 slices shown]
[im 29/86  soft-tissue]
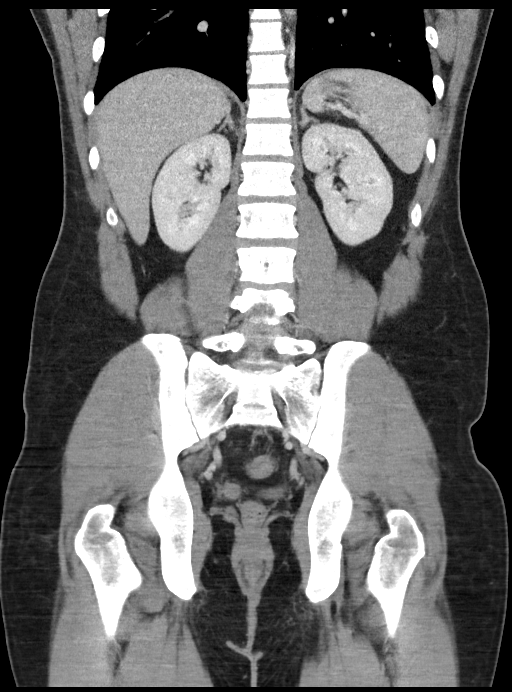
[im 38/86  soft-tissue]
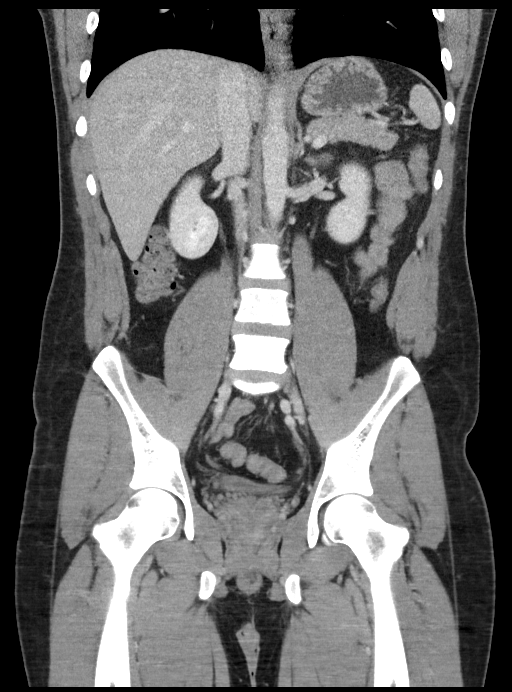
[im 48/86  soft-tissue]
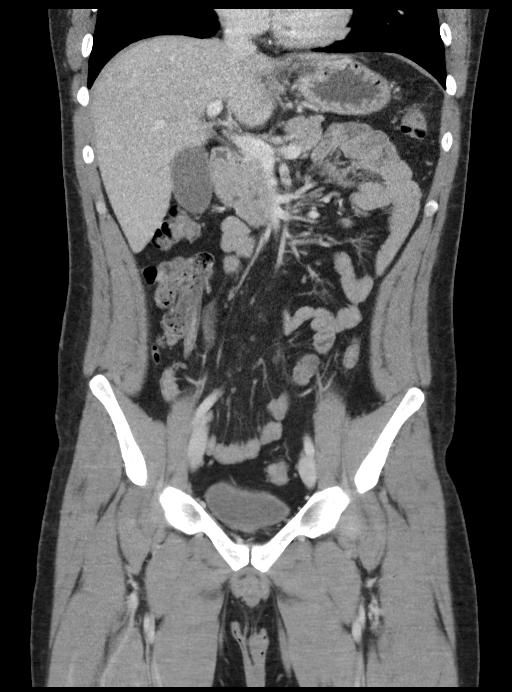

[16 of 46 positions shown; findings below may reference images not displayed]

FINDINGS: Lower chest: No focal pulmonary opacity. No pleural or pericardial
effusion.

Hepatobiliary: The liver is normal in contour. The hepatic and
portal veins are patent. No intra or extrahepatic biliary ductal
dilatation. The gallbladder is unremarkable.

Pancreas: Unremarkable. No pancreatic ductal dilatation or
surrounding inflammatory changes.

Spleen: Normal in size without focal abnormality.

Adrenals/Urinary Tract: The adrenal glands are unremarkable. The
kidneys enhance symmetrically with no hydronephrosis. The bladder is
unremarkable for degree of underdistention.

Stomach/Bowel: Small hiatal hernia. The bowel is normal in caliber
with no evidence of obstruction. The appendix is unremarkable. No
distention or wall thickening.

Vascular/Lymphatic: No significant vascular findings are present. No
enlarged abdominal or pelvic lymph nodes.

Reproductive: Prostate is unremarkable.

Other: No free fluid or free air in the abdomen or pelvis. No
abdominal abnormality.

Musculoskeletal: No acute or significant osseous findings.
IMPRESSION: 1. Small hiatal hernia.
2. No acute process in the abdomen or pelvis. No etiology seen for
the patient's abdominal pain, nausea, or vomiting.

## 2023-12-29 ENCOUNTER — Other Ambulatory Visit: Payer: Self-pay

## 2023-12-29 ENCOUNTER — Emergency Department (HOSPITAL_BASED_OUTPATIENT_CLINIC_OR_DEPARTMENT_OTHER)
Admission: EM | Admit: 2023-12-29 | Discharge: 2023-12-29 | Disposition: A | Discharge status: Home / Self Care | Payer: Self-pay | Attending: Emergency Medicine | Admitting: Emergency Medicine

## 2023-12-29 ENCOUNTER — Encounter (HOSPITAL_BASED_OUTPATIENT_CLINIC_OR_DEPARTMENT_OTHER): Payer: Self-pay

## 2023-12-29 DIAGNOSIS — D72829 Elevated white blood cell count, unspecified: Secondary | ICD-10-CM | POA: Insufficient documentation

## 2023-12-29 DIAGNOSIS — K920 Hematemesis: Secondary | ICD-10-CM | POA: Insufficient documentation

## 2023-12-29 LAB — CBC WITH DIFFERENTIAL/PLATELET
Abs Immature Granulocytes: 0.08 10*3/uL — ABNORMAL HIGH (ref 0.00–0.07)
Basophils Absolute: 0 10*3/uL (ref 0.0–0.1)
Basophils Relative: 0 %
Eosinophils Absolute: 0 10*3/uL (ref 0.0–0.5)
Eosinophils Relative: 0 %
HCT: 41.2 % (ref 39.0–52.0)
Hemoglobin: 13.9 g/dL (ref 13.0–17.0)
Immature Granulocytes: 1 %
Lymphocytes Relative: 23 %
Lymphs Abs: 2.5 10*3/uL (ref 0.7–4.0)
MCH: 30 pg (ref 26.0–34.0)
MCHC: 33.7 g/dL (ref 30.0–36.0)
MCV: 88.8 fL (ref 80.0–100.0)
Monocytes Absolute: 1 10*3/uL (ref 0.1–1.0)
Monocytes Relative: 9 %
Neutro Abs: 7.3 10*3/uL (ref 1.7–7.7)
Neutrophils Relative %: 67 %
Platelets: 246 10*3/uL (ref 150–400)
RBC: 4.64 MIL/uL (ref 4.22–5.81)
RDW: 13.7 % (ref 11.5–15.5)
WBC: 10.9 10*3/uL — ABNORMAL HIGH (ref 4.0–10.5)
nRBC: 0 % (ref 0.0–0.2)

## 2023-12-29 LAB — COMPREHENSIVE METABOLIC PANEL WITH GFR
ALT: 23 U/L (ref 0–44)
AST: 24 U/L (ref 15–41)
Albumin: 4.8 g/dL (ref 3.5–5.0)
Alkaline Phosphatase: 55 U/L (ref 38–126)
Anion gap: 13 (ref 5–15)
BUN: 17 mg/dL (ref 6–20)
CO2: 24 mmol/L (ref 22–32)
Calcium: 9.4 mg/dL (ref 8.9–10.3)
Chloride: 100 mmol/L (ref 98–111)
Creatinine, Ser: 0.96 mg/dL (ref 0.61–1.24)
GFR, Estimated: >60 mL/min (ref >60)
Glucose, Bld: 89 mg/dL (ref 70–99)
Potassium: 3.9 mmol/L (ref 3.5–5.1)
Sodium: 137 mmol/L (ref 135–145)
Total Bilirubin: 0.6 mg/dL (ref 0.0–1.2)
Total Protein: 7.3 g/dL (ref 6.5–8.1)

## 2023-12-29 LAB — LIPASE, BLOOD: Lipase: 23 U/L (ref 11–51)

## 2023-12-29 MED ORDER — PANTOPRAZOLE SODIUM 40 MG PO TBEC: 40.0000 mg | DELAYED_RELEASE_TABLET | Freq: Every day | ORAL | 0 refills | Status: AC

## 2023-12-29 MED ORDER — ONDANSETRON HCL 4 MG/2ML IJ SOLN
4.0000 mg | Freq: Once | INTRAMUSCULAR | Status: AC
  Administered 2023-12-29: 4 mg via INTRAVENOUS
  Filled 2023-12-29: qty 2

## 2023-12-29 MED ORDER — PANTOPRAZOLE SODIUM 40 MG IV SOLR
40.0000 mg | Freq: Once | INTRAVENOUS | Status: AC
  Administered 2023-12-29: 40 mg via INTRAVENOUS
  Filled 2023-12-29: qty 10

## 2023-12-29 NOTE — Discharge Instructions (Addendum)
 Your work appears reassuring.  Your blood counts are normal.  Your kidney, liver, and pancreas labs are normal.  Your electrolytes are normal.  Please take the Protonix  (pantoprazole ) daily as prescribed for the next 30 days.  Please follow-up with one of the GI doctors listed below within the next 2 weeks for follow-up and further management of your symptoms.  Please do not drink alcohol or use any NSAID medications (ibuprofen , Advil , BC powder, etc.) as this can cause your bleeding to worsen and can worsen any potential ulcers.  You may use your Zofran  you have at home as prescribed for nausea and vomiting.  Please return to the ER if you have any persistent vomiting, persistently bloody vomit, severe abdominal pain, dizziness, any other new or concerning symptoms

## 2023-12-29 NOTE — ED Notes (Signed)
 Reviewed discharge instructions medication and follow up with pt. Pt states feeling better. Ambulatory at discharge home

## 2023-12-29 NOTE — ED Triage Notes (Signed)
 Pt complaining of pain in his stomach from his ulcers acting up. Started on Friday night and has started vomiting coffee ground emesis. Has ran out of his protonix  prescription.

## 2023-12-29 NOTE — ED Provider Notes (Signed)
 [place] EMERGENCY DEPARTMENT AT [facility] Provider Note   CSN: [id number] Arrival date & time: 12/29/23  1310     History  Chief Complaint  Patient presents with   Abdominal Pain    Jason Cruz is a 28 y.o. male with a history of polysubstance abuse including alcohol abuse, Mallory-Weiss tears, presents with concern for bloody emesis has been ongoing for the past 6 days.  States he went out to eat and had pizza and 4 beers, and then his nausea and vomiting started after this.  He states initially the blood looked bright red, but he has also had some emesis that appears brownish/black.  He denies any dizziness or weakness.  Reports some mild abdominal pain that occurs after these vomiting episodes, but no abdominal pain consistently.  Denies any fever or chills.  States he usually takes Protonix  daily, but ran out of this medication.  He feels that running out of this medication is what is causing his symptoms to occur.   Abdominal Pain      Home Medications Prior to Admission medications   Medication Sig Start Date End Date Taking? Authorizing Provider  pantoprazole  (PROTONIX ) 40 MG tablet Take 1 tablet (40 mg total) by mouth daily. 12/29/23 01/28/24 Yes Rexie Catena, PA-C  acetaminophen  (TYLENOL ) 325 MG tablet Take 2 tablets (650 mg total) by mouth every 6 (six) hours as needed for up to 30 doses for fever, moderate pain, mild pain or headache. 07/10/21   Arvilla Birmingham, MD  acetaminophen  (TYLENOL ) 500 MG tablet Take 500 mg by mouth every 6 (six) hours as needed for mild pain, moderate pain or headache.    [provider]  benzonatate  (TESSALON ) 100 MG capsule Take 1 capsule (100 mg total) by mouth every 8 (eight) hours. 07/11/21   Redwine, Madison A, PA-C  docusate sodium  (COLACE) 100 MG capsule Take 1 capsule (100 mg total) by mouth 2 (two) times daily. While taking narcotic pain medicine. 10/31/20   Debbra Fairy, PA-C  folic acid  (FOLVITE ) 1 MG  tablet Take 1 tablet (1 mg total) by mouth daily. 11/11/20   Vann, Jessica U, DO  ibuprofen  (ADVIL ) 800 MG tablet Take 1 tablet (800 mg total) by mouth every 8 (eight) hours as needed for up to 21 doses. 07/10/21   Arvilla Birmingham, MD  ondansetron  (ZOFRAN -ODT) 4 MG disintegrating tablet 4mg  ODT q4 hours prn nausea/vomit 08/02/21   Hershel Los, MD  oxyCODONE  (ROXICODONE ) 5 MG immediate release tablet Take 1 tablet (5 mg total) by mouth every 4 (four) hours as needed for severe pain. 12/18/21   Teddi Favors, DO  oxyCODONE -acetaminophen  (PERCOCET/ROXICET) 5-325 MG tablet oxycodone -acetaminophen  5 mg-325 mg tablet  TAKE 1-2 TABLETS BY MOUTH EVERY 8 (EIGHT) HOURS AS NEEDED FOR SEVERE PAIN.    [provider]  promethazine -dextromethorphan (PROMETHAZINE -DM) 6.25-15 MG/5ML syrup Take 5 mLs by mouth 4 (four) times daily as needed. 07/23/21   [provider]  sucralfate  (CARAFATE ) 1 GM/10ML suspension Take 10 mLs (1 g total) by mouth 4 (four) times daily -  with meals and at bedtime. 08/03/21   Hershel Los, MD  thiamine  100 MG tablet Take 1 tablet (100 mg total) by mouth daily. 11/11/20   Vann, Jessica U, DO  traMADol (ULTRAM) 50 MG tablet Take 50-100 mg by mouth as needed for moderate pain or severe pain. 11/05/20   [provider]      Allergies    Ibuprofen     Review  of Systems   Review of Systems  Gastrointestinal:  Positive for abdominal pain.    Physical Exam Updated Vital Signs BP 126/79 (BP Location: Left Arm)   Pulse (!) 55   Temp 98.7 F (37.1 C) (Oral)   Resp 18   Ht 5' 6 (1.676 m)   Wt 76.2 kg   SpO2 100%   BMI 27.12 kg/m  Physical Exam Vitals and nursing note reviewed.  Constitutional:      General: He is not in acute distress.    Appearance: He is well-developed.     Comments: Well-appearing, no active vomiting  HENT:     Head: Normocephalic and atraumatic.     Mouth/Throat:     Comments: No bleeding noted Eyes:     Conjunctiva/sclera:  Conjunctivae normal.  Cardiovascular:     Rate and Rhythm: Normal rate and regular rhythm.     Heart sounds: No murmur heard. Pulmonary:     Effort: Pulmonary effort is normal. No respiratory distress.     Breath sounds: Normal breath sounds.  Abdominal:     Palpations: Abdomen is soft.     Tenderness: There is no abdominal tenderness.     Comments: Abdomen is soft nontender, no rebound or guarding  Musculoskeletal:        General: No swelling.     Cervical back: Neck supple.  Skin:    General: Skin is warm and dry.     Capillary Refill: Capillary refill takes less than 2 seconds.  Neurological:     Mental Status: He is alert.  Psychiatric:        Mood and Affect: Mood normal.     ED Results / Procedures / Treatments   Labs (all labs ordered are listed, but only abnormal results are displayed) Labs Reviewed  CBC WITH DIFFERENTIAL/PLATELET - Abnormal; Notable for the following components:      Result Value   WBC 10.9 (*)    Abs Immature Granulocytes 0.08 (*)    All other components within normal limits  COMPREHENSIVE METABOLIC PANEL WITH GFR  LIPASE, BLOOD    EKG None  Radiology No results found.  Procedures Procedures    Medications Ordered in ED Medications  pantoprazole  (PROTONIX ) injection 40 mg (40 mg Intravenous Given 12/29/23 1351)  ondansetron  (ZOFRAN ) injection 4 mg (4 mg Intravenous Given 12/29/23 1352)    ED Course/ Medical Decision Making/ A&P                                 Medical Decision Making Amount and/or Complexity of Data Reviewed Labs: ordered.  Risk Prescription drug management.     Differential diagnosis includes but is not limited to Mallory-Weiss tear, peptic ulcer, gastritis, variceal bleed, gastroenteritis  ED Course:  Upon initial evaluation, patient is well-appearing with stable vital signs.  No active vomiting currently.  No abdominal tenderness palpation.  Labs Ordered: I Ordered, and personally interpreted labs.   The pertinent results include:   CBC with mild leukocytosis at 10.9.  Hemoglobin within normal limits CMP and lipase within normal limits  Medications Given: Zofran   Protonix   Upon re-evaluation, patient still remains well-appearing with stable vitals.  He has not had any episodes of emesis here in the emergency room.  He was p.o. challenged with water and tolerated this well.  He did report hematemesis at home, but very low concern for variceal bleed given no active hematemesis here, no history  of varices or liver problems.  His hemoglobin remains stable, denies any symptoms such as dizziness or weakness.  Suspect he may have Mallory-Weiss tear given the reported repetitive vomiting at home.  Feel he stable for discharge at this time with close follow-up with GI.    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  He states he has Zofran  at home he will use for nausea.  Follow-up with GI within the next 2 weeks, their contact information was provided.  Patient understands he needs to call and schedule this appointment. Return precautions given.    Record Review: External records from outside source obtained and reviewed including many previous ER visits for gastric ulcer and mallory weiss bleeding     This chart was dictated using voice recognition software, Dragon. Despite the best efforts of this provider to proofread and correct errors, errors may still occur which can change documentation meaning.          Final Clinical Impression(s) / ED Diagnoses Final diagnoses:  Hematemesis with nausea    Rx / DC Orders ED Discharge Orders          Ordered    pantoprazole  (PROTONIX ) 40 MG tablet  Daily        12/29/23 1553              Rexie Catena, PA-C 12/29/23 1610    Mordecai Applebaum, MD 01/04/24 [phone]

## 2024-01-26 ENCOUNTER — Ambulatory Visit: Payer: Self-pay | Admitting: Physician Assistant
